# Patient Record
Sex: Female | Born: 1995 | Race: Black or African American | Hispanic: No | Marital: Single | Smoking: Current every day smoker
Health system: Southern US, Community
[De-identification: ages and names within clinical notes are randomized; demographics above are authoritative.]

## PROBLEM LIST (undated history)

## (undated) ENCOUNTER — Inpatient Hospital Stay (HOSPITAL_COMMUNITY): Payer: Self-pay

## (undated) DIAGNOSIS — N83209 Unspecified ovarian cyst, unspecified side: Secondary | ICD-10-CM

## (undated) DIAGNOSIS — Z789 Other specified health status: Secondary | ICD-10-CM

## (undated) HISTORY — PX: NO PAST SURGERIES: SHX2092

## (undated) HISTORY — PX: WISDOM TOOTH EXTRACTION: SHX21

---

## 2018-10-24 ENCOUNTER — Encounter (HOSPITAL_COMMUNITY): Payer: Self-pay | Admitting: *Deleted

## 2018-10-24 ENCOUNTER — Inpatient Hospital Stay (HOSPITAL_COMMUNITY): Payer: Commercial Managed Care - PPO

## 2018-10-24 ENCOUNTER — Inpatient Hospital Stay (HOSPITAL_COMMUNITY)
Admission: AD | Admit: 2018-10-24 | Discharge: 2018-10-24 | Disposition: A | Discharge status: Home / Self Care | Payer: Commercial Managed Care - PPO | Attending: Obstetrics and Gynecology | Admitting: Obstetrics and Gynecology

## 2018-10-24 ENCOUNTER — Other Ambulatory Visit: Payer: Self-pay

## 2018-10-24 DIAGNOSIS — Z87891 Personal history of nicotine dependence: Secondary | ICD-10-CM | POA: Insufficient documentation

## 2018-10-24 DIAGNOSIS — O208 Other hemorrhage in early pregnancy: Secondary | ICD-10-CM

## 2018-10-24 DIAGNOSIS — O034 Incomplete spontaneous abortion without complication: Secondary | ICD-10-CM | POA: Diagnosis not present

## 2018-10-24 DIAGNOSIS — O209 Hemorrhage in early pregnancy, unspecified: Secondary | ICD-10-CM

## 2018-10-24 HISTORY — DX: Other specified health status: Z78.9

## 2018-10-24 LAB — URINALYSIS, ROUTINE W REFLEX MICROSCOPIC
Bilirubin Urine: NEGATIVE
GLUCOSE, UA: NEGATIVE mg/dL
Ketones, ur: NEGATIVE mg/dL
Nitrite: NEGATIVE
Protein, ur: 30 mg/dL — AB
RBC / HPF: >50 RBC/hpf — ABNORMAL HIGH (ref 0–5)
Specific Gravity, Urine: 1.026 (ref 1.005–1.030)
pH: 5 (ref 5.0–8.0)

## 2018-10-24 LAB — CBC
HCT: 44 % (ref 36.0–46.0)
Hemoglobin: 14.7 g/dL (ref 12.0–15.0)
MCH: 30.2 pg (ref 26.0–34.0)
MCHC: 33.4 g/dL (ref 30.0–36.0)
MCV: 90.3 fL (ref 80.0–100.0)
Platelets: 236 10*3/uL (ref 150–400)
RBC: 4.87 MIL/uL (ref 3.87–5.11)
RDW: 12.6 % (ref 11.5–15.5)
WBC: 11.1 10*3/uL — ABNORMAL HIGH (ref 4.0–10.5)
nRBC: 0 % (ref 0.0–0.2)

## 2018-10-24 LAB — ABO/RH: ABO/RH(D): A POS

## 2018-10-24 LAB — WET PREP, GENITAL
Sperm: NONE SEEN
Trich, Wet Prep: NONE SEEN
Yeast Wet Prep HPF POC: NONE SEEN

## 2018-10-24 LAB — POCT PREGNANCY, URINE: Preg Test, Ur: POSITIVE — AB

## 2018-10-24 LAB — HCG, QUANTITATIVE, PREGNANCY: hCG, Beta Chain, Quant, S: 6716 m[IU]/mL — ABNORMAL HIGH (ref <5)

## 2018-10-24 MED ORDER — PROMETHAZINE HCL 25 MG PO TABS: 25.0000 mg | ORAL_TABLET | Freq: Four times a day (QID) | ORAL | 0 refills | Status: DC | PRN

## 2018-10-24 MED ORDER — IBUPROFEN 800 MG PO TABS: 800.0000 mg | ORAL_TABLET | Freq: Three times a day (TID) | ORAL | 0 refills | Status: DC

## 2018-10-24 MED ORDER — OXYCODONE-ACETAMINOPHEN 5-325 MG PO TABS: 1.0000 | ORAL_TABLET | ORAL | 0 refills | Status: DC | PRN

## 2018-10-24 NOTE — MAU Provider Note (Signed)
History     CSN: [id number]  Arrival date and time: 10/24/18 1331   First Provider Initiated Contact with Patient 10/24/18 1416      Chief Complaint  Patient presents with  . Vaginal Bleeding  . Abdominal Pain   HPI Sue Lee is a 23 y.o. G3P2002 at [redacted]w[redacted]d who presents with vaginal bleeding and pain. She states she started spotting on Tuesday and it has gradually gotten worse. She also reports cramps that she rates a 7/10 for the last 3 days. She denies any discharge. Reports a positive HPT last month. She has not been seen anywhere for the pregnancy yet.  OB History    Gravida  3   Para  2   Term  2   Preterm      AB      Living  2     SAB      TAB      Ectopic      Multiple      Live Births  2           Past Medical History:  Diagnosis Date  . Medical history non-contributory     Past Surgical History:  Procedure Laterality Date  . NO PAST SURGERIES      Family History  Problem Relation Age of Onset  . Healthy Mother   . Healthy Father     Social History   Tobacco Use  . Smoking status: Former Smoker    Types: Cigarettes  . Smokeless tobacco: Never Used  Substance Use Topics  . Alcohol use: Not Currently  . Drug use: Never    Allergies: No Known Allergies  Medications Prior to Admission  Medication Sig Dispense Refill Last Dose  . prenatal vitamin w/FE, FA (PRENATAL 1 + 1) 27-1 MG TABS tablet Take 1 tablet by mouth daily at 12 noon.   10/24/2018 at Unknown time    Review of Systems  Constitutional: Negative.  Negative for fatigue and fever.  HENT: Negative.   Respiratory: Negative.  Negative for shortness of breath.   Cardiovascular: Negative.  Negative for chest pain.  Gastrointestinal: Positive for abdominal pain. Negative for constipation, diarrhea, nausea and vomiting.  Genitourinary: Positive for vaginal bleeding. Negative for dysuria.  Neurological: Negative.  Negative for dizziness and headaches.   Physical Exam    Blood pressure 118/90, pulse 85, temperature 98.4 F (36.9 C), resp. rate 20, height 5\' 2"  (1.575 m), weight 49.7 kg, last menstrual period 08/17/2018, SpO2 100 %.  Physical Exam  Nursing note and vitals reviewed. Constitutional: She is oriented to person, place, and time. She appears well-developed and well-nourished. No distress.  HENT:  Head: Normocephalic.  Eyes: Pupils are equal, round, and reactive to light.  Cardiovascular: Normal rate, regular rhythm and normal heart sounds.  Respiratory: Effort normal and breath sounds normal. No respiratory distress.  GI: Soft. Bowel sounds are normal. She exhibits no distension. There is no abdominal tenderness.  Genitourinary:    Genitourinary Comments: Moderate amount of bright red bleeding in vault and coming from os. Tenderness on right adnexa   Neurological: She is alert and oriented to person, place, and time.  Skin: Skin is warm and dry.  Psychiatric: She has a normal mood and affect. Her behavior is normal. Judgment and thought content normal.    MAU Course  Procedures Results for orders placed or performed during the hospital encounter of 10/24/18 (from the past 24 hour(s))  Pregnancy, urine POC     Status:  Abnormal   Collection Time: 10/24/18  1:53 PM  Result Value Ref Range   Preg Test, Ur POSITIVE (A) NEGATIVE  Urinalysis, Routine w reflex microscopic     Status: Abnormal   Collection Time: 10/24/18  1:55 PM  Result Value Ref Range   Color, Urine YELLOW YELLOW   APPearance HAZY (A) CLEAR   Specific Gravity, Urine 1.026 1.005 - 1.030   pH 5.0 5.0 - 8.0   Glucose, UA NEGATIVE NEGATIVE mg/dL   Hgb urine dipstick LARGE (A) NEGATIVE   Bilirubin Urine NEGATIVE NEGATIVE   Ketones, ur NEGATIVE NEGATIVE mg/dL   Protein, ur 30 (A) NEGATIVE mg/dL   Nitrite NEGATIVE NEGATIVE   Leukocytes,Ua SMALL (A) NEGATIVE   RBC / HPF >50 (H) 0 - 5 RBC/hpf   WBC, UA 6-10 0 - 5 WBC/hpf   Bacteria, UA RARE (A) NONE SEEN   Squamous Epithelial  / LPF 6-10 0 - 5   Mucus PRESENT   CBC     Status: Abnormal   Collection Time: 10/24/18  2:39 PM  Result Value Ref Range   WBC 11.1 (H) 4.0 - 10.5 K/uL   RBC 4.87 3.87 - 5.11 MIL/uL   Hemoglobin 14.7 12.0 - 15.0 g/dL   HCT 42.6 83.4 - 19.6 %   MCV 90.3 80.0 - 100.0 fL   MCH 30.2 26.0 - 34.0 pg   MCHC 33.4 30.0 - 36.0 g/dL   RDW 22.2 97.9 - 89.2 %   Platelets 236 150 - 400 K/uL   nRBC 0.0 0.0 - 0.2 %  hCG, quantitative, pregnancy     Status: Abnormal   Collection Time: 10/24/18  2:39 PM  Result Value Ref Range   hCG, Beta Chain, Quant, S 6,716 (H) <5 mIU/mL  ABO/Rh     Status: None   Collection Time: 10/24/18  2:39 PM  Result Value Ref Range   ABO/RH(D) A POS    No rh immune globuloin      NOT A RH IMMUNE GLOBULIN CANDIDATE, PT RH POSITIVE Performed at [facility], [address], [place], [place] [address]   Wet prep, genital     Status: Abnormal   Collection Time: 10/24/18  3:03 PM  Result Value Ref Range   Yeast Wet Prep HPF POC NONE SEEN NONE SEEN   Trich, Wet Prep NONE SEEN NONE SEEN   Clue Cells Wet Prep HPF POC PRESENT (A) NONE SEEN   WBC, Wet Prep HPF POC MANY (A) NONE SEEN   Sperm NONE SEEN    US Ob Less Than 14 Weeks With Ob Transvaginal  Result Date: 10/24/2018 CLINICAL DATA:  Vaginal bleeding in pregnant patient. EXAM: OBSTETRIC <14 WK [place] AND TRANSVAGINAL OB US TECHNIQUE: Both transabdominal and transvaginal ultrasound examinations were performed for complete evaluation of the gestation as well as the maternal uterus, adnexal regions, and pelvic cul-de-sac. Transvaginal technique was performed to assess early pregnancy. COMPARISON:  None. FINDINGS: Intrauterine gestational sac: There is a gestational sac in the lower uterine segment. Yolk sac:  Possible Embryo:  Possible Cardiac Activity: No MSD:   mm    w     d CRL:  4.8 mm   6 w   1 d                  [place] [facility]: June 19, 2019 Subchorionic hemorrhage:  Moderate Maternal uterus/adnexae: Normal. IMPRESSION: The  findings are most consistent with a spontaneous abortion in progress with an irregular gestational sac,  possibly containing a yolk sac and embryo, in the lower uterine segment with moderate subchorionic blood. Electronically Signed   By: Gerome Samavid  Williams III M.D   On: 10/24/2018 16:30   MDM UA, UPT CBC, HCG ABO/Rh- A Pos Wet prep and gc/chlamydia US OB Comp Less 14 weeks with Transvaginal  Results reviewed with patient. Options of expectant management vs. cytotec reviewed. Patient desires expectant management.   Assessment and Plan   1. Incomplete miscarriage   2. Vaginal bleeding affecting early pregnancy    -Discharge home in stable condition -Rx for limited percocet and ibuprofen given to patient. -Vaginal bleeding and pain precautions discussed -[facility] -Patient may return to MAU as needed or if her condition were to change or worsen   Rolm BookbinderCaroline M  CNM 10/24/2018, 4:36 PM

## 2018-10-24 NOTE — MAU Note (Signed)
Presents with c/o VB and lower abdominal pain, greater on right side that began Tuesday.  LMP 08/17/18.  Reports +HPT.

## 2018-10-24 NOTE — Discharge Instructions (Signed)
Miscarriage  A miscarriage is the loss of an unborn baby (fetus) before the 20th week of pregnancy. Most miscarriages happen during the first 3 months of pregnancy. Sometimes, a miscarriage can happen before a woman knows that she is pregnant.  Having a miscarriage can be an emotional experience. If you have had a miscarriage, talk with your health care provider about any questions you may have about miscarrying, the grieving process, and your plans for future pregnancy.  What are the causes?  A miscarriage may be caused by:  · Problems with the genes or chromosomes of the fetus. These problems make it impossible for the baby to develop normally. They are often the result of random errors that occur early in the development of the baby, and are not passed from parent to child (not inherited).  · Infection of the cervix or uterus.  · Conditions that affect hormone balance in the body.  · Problems with the cervix, such as the cervix opening and thinning before pregnancy is at term (cervical insufficiency).  · Problems with the uterus. These may include:  ? A uterus with an abnormal shape.  ? Fibroids in the uterus.  ? Congenital abnormalities. These are problems that were present at birth.  · Certain medical conditions.  · Smoking, drinking alcohol, or using drugs.  · Injury (trauma).  In many cases, the cause of a miscarriage is not known.  What are the signs or symptoms?  Symptoms of this condition include:  · Vaginal bleeding or spotting, with or without cramps or pain.  · Pain or cramping in the abdomen or lower back.  · Passing fluid, tissue, or blood clots from the vagina.  How is this diagnosed?  This condition may be diagnosed based on:  · A physical exam.  · Ultrasound.  · Blood tests.  · Urine tests.  How is this treated?  Treatment for a miscarriage is sometimes not necessary if you naturally pass all the tissue that was in your uterus. If necessary, this condition may be treated with:  · Dilation and  curettage (D&C). This is a procedure in which the cervix is stretched open and the lining of the uterus (endometrium) is scraped. This is done only if tissue from the fetus or placenta remains in the body (incomplete miscarriage).  · Medicines, such as:  ? Antibiotic medicine, to treat infection.  ? Medicine to help the body pass any remaining tissue.  ? Medicine to reduce (contract) the size of the uterus. These medicines may be given if you have a lot of bleeding.  If you have Rh negative blood and your baby was Rh positive, you will need a shot of a medicine called Rh immunoglobulinto protect your future babies from Rh blood problems. "Rh-negative" and "Rh-positive" refer to whether or not the blood has a specific protein found on the surface of red blood cells (Rh factor).  Follow these instructions at home:  Medicines    · Take over-the-counter and prescription medicines only as told by your health care provider.  · If you were prescribed antibiotic medicine, take it as told by your health care provider. Do not stop taking the antibiotic even if you start to feel better.  · Do not take NSAIDs, such as aspirin and ibuprofen, unless they are approved by your health care provider. These medicines can cause bleeding.  Activity  · Rest as directed. Ask your health care provider what activities are safe for you.  · Have someone   help with home and family responsibilities during this time.  General instructions  · Keep track of the number of sanitary pads you use each day and how soaked (saturated) they are. Write down this information.  · Monitor the amount of tissue or blood clots that you pass from your vagina. Save any large amounts of tissue for your health care provider to examine.  · Do not use tampons, douche, or have sex until your health care provider approves.  · To help you and your partner with the process of grieving, talk with your health care provider or seek counseling.  · When you are ready, meet with  your health care provider to discuss any important steps you should take for your health. Also, discuss steps you should take to have a healthy pregnancy in the future.  · Keep all follow-up visits as told by your health care provider. This is important.  Where to find more information  · The American Congress of Obstetricians and Gynecologists: www.acog.org  · U.S. Department of Health and Human Services Office of Women’s Health: www.womenshealth.gov  Contact a health care provider if:  · You have a fever or chills.  · You have a foul smelling vaginal discharge.  · You have more bleeding instead of less.  Get help right away if:  · You have severe cramps or pain in your back or abdomen.  · You pass blood clots or tissue from your vagina that is walnut-sized or larger.  · You soak more than 1 regular sanitary pad in an hour.  · You become light-headed or weak.  · You pass out.  · You have feelings of sadness that take over your thoughts, or you have thoughts of hurting yourself.  Summary  · Most miscarriages happen in the first 3 months of pregnancy. Sometimes miscarriage happens before a woman even knows that she is pregnant.  · Follow your health care provider's instruction for home care. Keep all follow-up appointments.  · To help you and your partner with the process of grieving, talk with your health care provider or seek counseling.  This information is not intended to replace advice given to you by your health care provider. Make sure you discuss any questions you have with your health care provider.  Document Released: 01/08/2001 Document Revised: 08/20/2016 Document Reviewed: 08/20/2016  Elsevier Interactive Patient Education © 2019 [employer]

## 2018-10-25 LAB — CULTURE, OB URINE: Culture: NO GROWTH

## 2018-10-26 LAB — GC/CHLAMYDIA PROBE AMP (~~LOC~~) NOT AT ARMC
Chlamydia: NEGATIVE
Neisseria Gonorrhea: NEGATIVE

## 2020-12-16 IMAGING — US OBSTETRIC <14 WK US AND TRANSVAGINAL OB US
1 series · 15 of 28 positions shown · non-contrast
Comparison: None.

CLINICAL DATA: Vaginal bleeding in pregnant patient.

EXAM:
OBSTETRIC <14 WK US AND TRANSVAGINAL OB US
TECHNIQUE: Both transabdominal and transvaginal ultrasound examinations were
performed for complete evaluation of the gestation as well as the
maternal uterus, adnexal regions, and pelvic cul-de-sac.
Transvaginal technique was performed to assess early pregnancy.

[Series 1: obstetric <14 wk us and transvaginal ob us · 83 acquisitions, 15 frames shown]
[im 1/83]
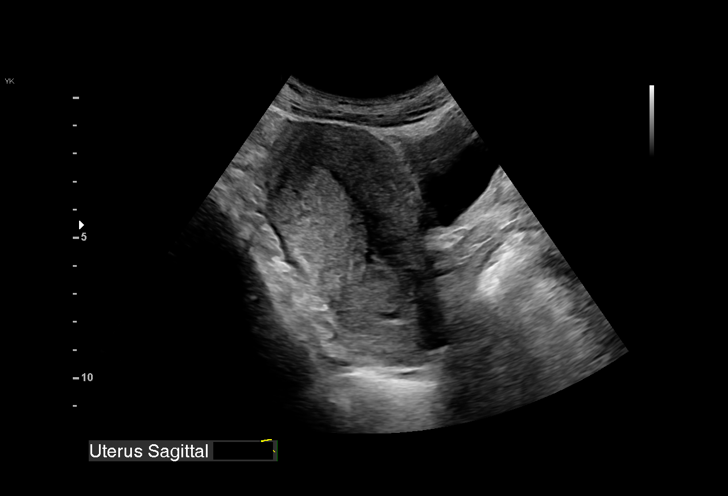
[im 7/83]
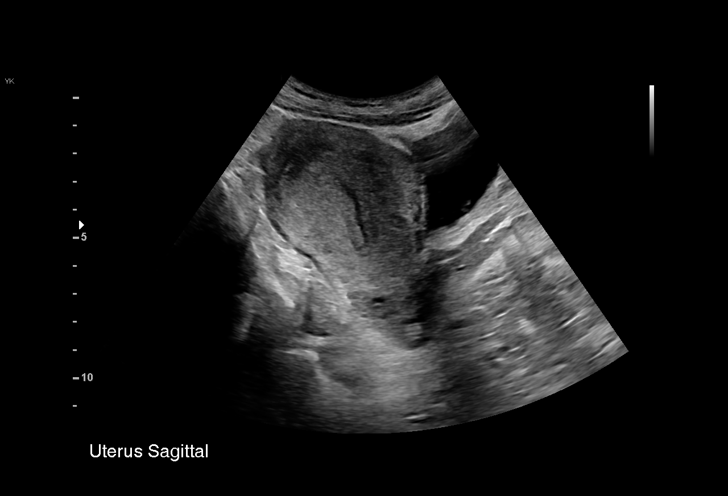
[im 13/83]
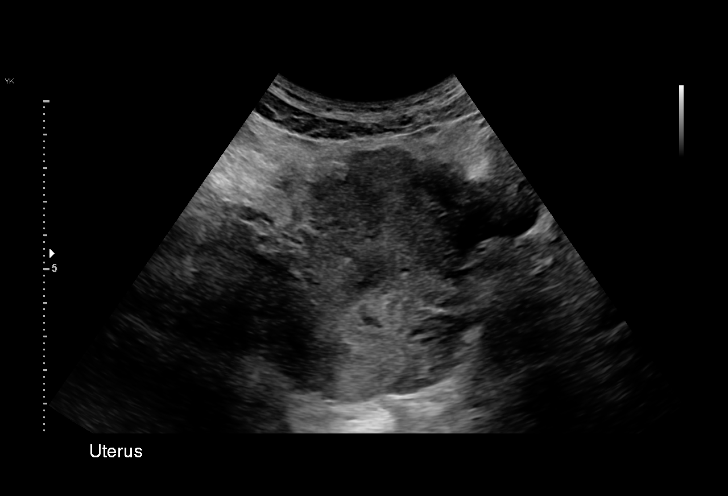
[im 19/83]
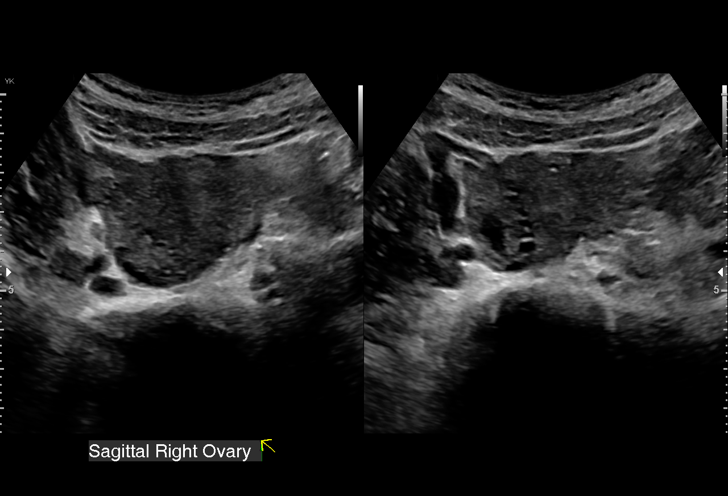
[im 25/83]
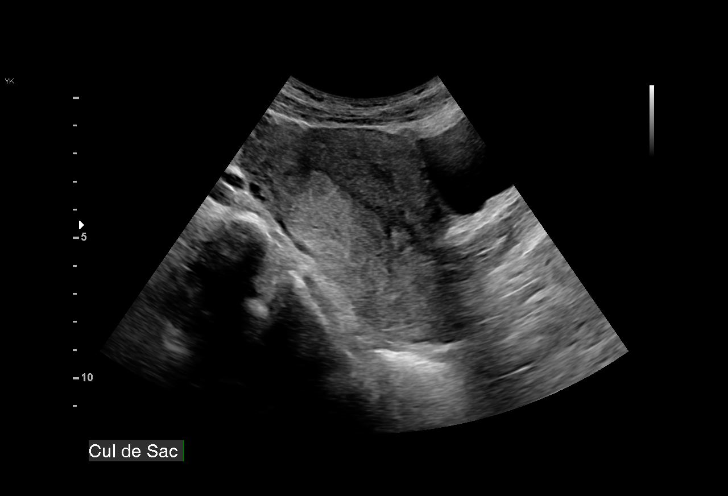
[im 31/83]
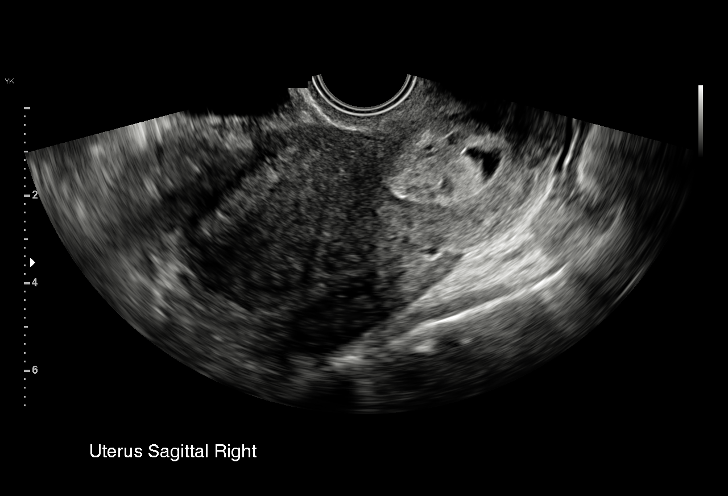
[im 37/83]
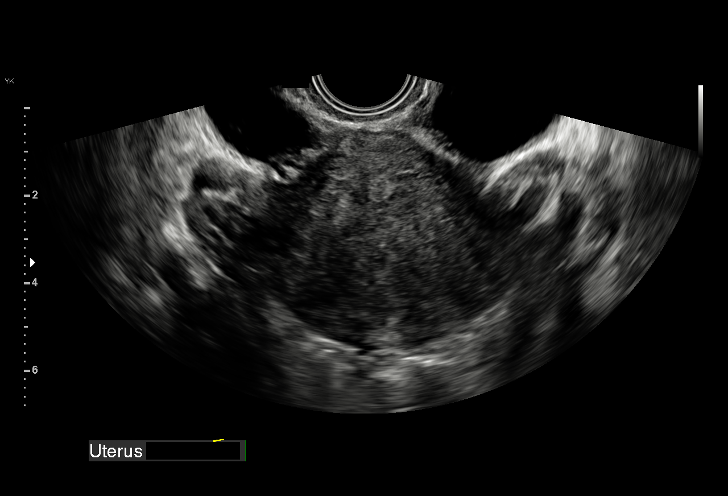
[im 43/83]
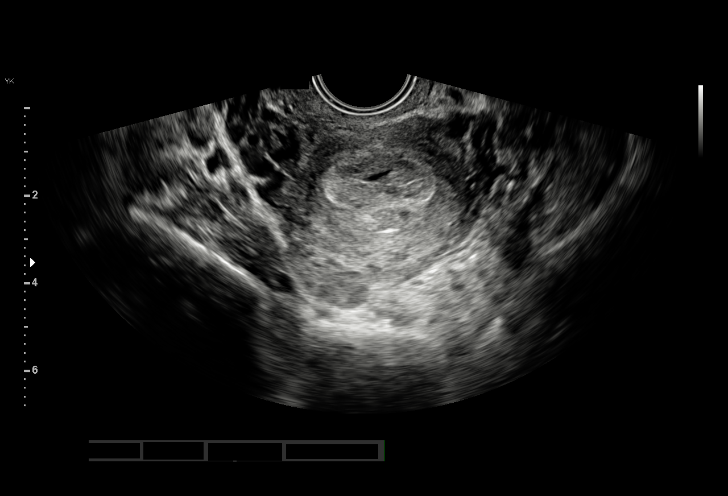
[im 46/83]
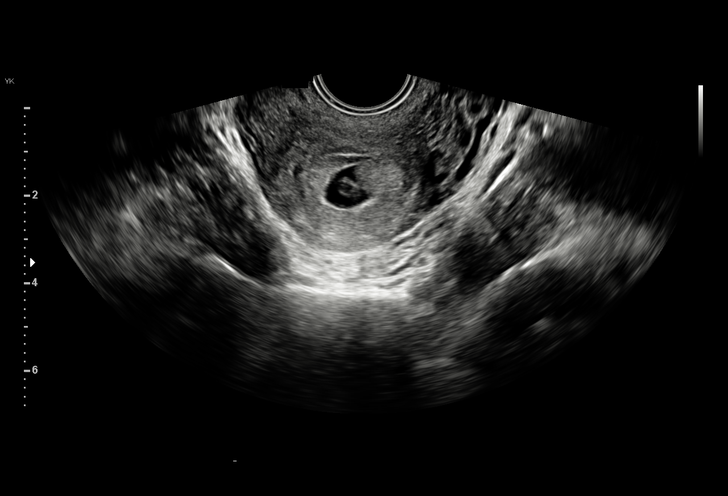
[im 52/83]
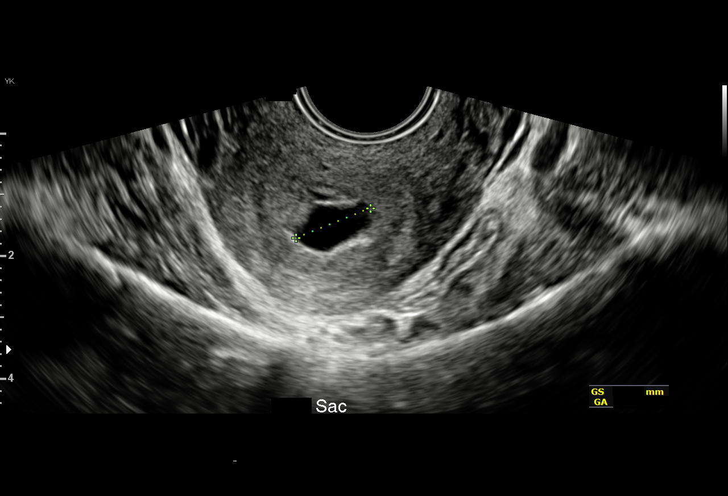
[im 58/83]
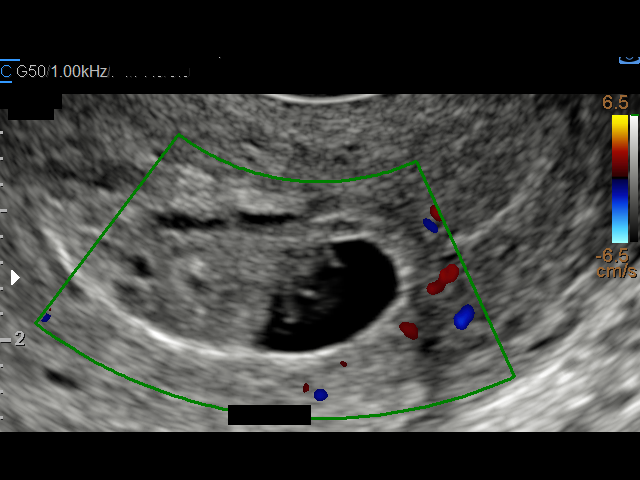
[im 64/83]
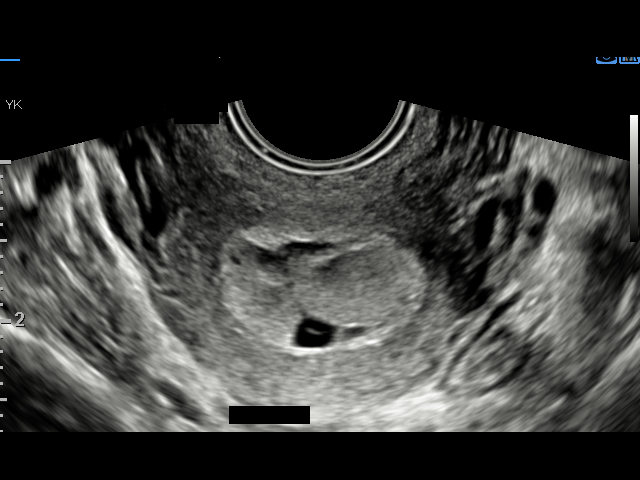
[im 70/83]
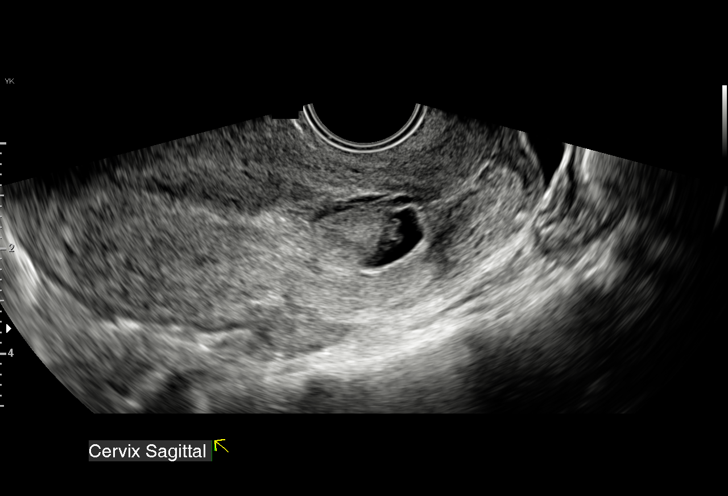
[im 76/83]
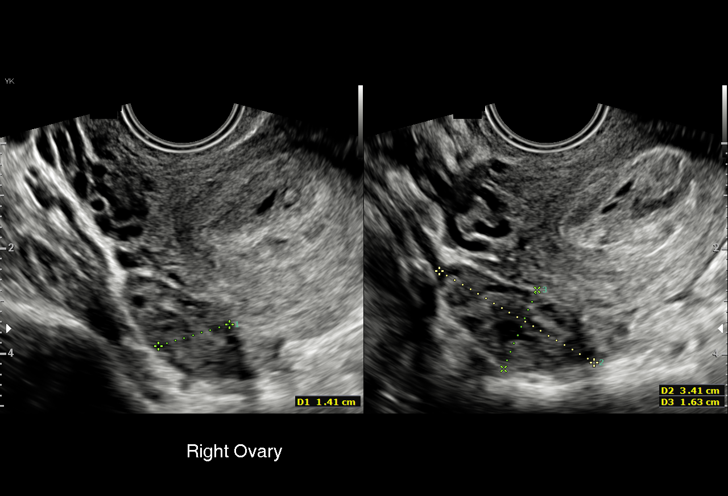
[im 83/83]
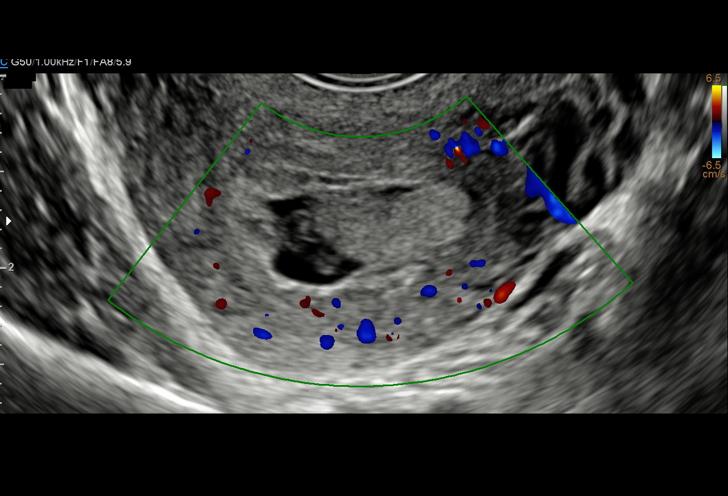

[15 of 28 positions shown; findings below may reference images not displayed]

FINDINGS: Intrauterine gestational sac: There is a gestational sac in the
lower uterine segment.

Yolk sac:  Possible

Embryo:  Possible

Cardiac Activity: No

MSD:   mm    w     d

CRL:  4.8 mm   6 w   1 d                  US EDC: June 19, 2019

Subchorionic hemorrhage:  Moderate

Maternal uterus/adnexae: Normal.
IMPRESSION: The findings are most consistent with a spontaneous abortion in
progress with an irregular gestational sac, possibly containing a
yolk sac and embryo, in the lower uterine segment with moderate
subchorionic blood.

## 2021-07-06 ENCOUNTER — Other Ambulatory Visit: Payer: Self-pay

## 2021-07-06 ENCOUNTER — Emergency Department (HOSPITAL_COMMUNITY)
Admission: EM | Admit: 2021-07-06 | Discharge: 2021-07-06 | Discharge status: Against Medical Advice | Payer: Medicaid Other

## 2021-07-06 NOTE — ED Notes (Signed)
Pt called for triage no answer  °

## 2021-07-07 ENCOUNTER — Emergency Department (HOSPITAL_COMMUNITY)
Admission: EM | Admit: 2021-07-07 | Discharge: 2021-07-07 | Disposition: A | Discharge status: Home / Self Care | Payer: Medicaid Other | Attending: Emergency Medicine | Admitting: Emergency Medicine

## 2021-07-07 ENCOUNTER — Other Ambulatory Visit: Payer: Self-pay

## 2021-07-07 ENCOUNTER — Encounter (HOSPITAL_COMMUNITY): Payer: Self-pay | Admitting: Emergency Medicine

## 2021-07-07 DIAGNOSIS — M62830 Muscle spasm of back: Secondary | ICD-10-CM | POA: Insufficient documentation

## 2021-07-07 DIAGNOSIS — F17210 Nicotine dependence, cigarettes, uncomplicated: Secondary | ICD-10-CM | POA: Diagnosis not present

## 2021-07-07 DIAGNOSIS — Y92410 Unspecified street and highway as the place of occurrence of the external cause: Secondary | ICD-10-CM | POA: Insufficient documentation

## 2021-07-07 DIAGNOSIS — M545 Low back pain, unspecified: Secondary | ICD-10-CM | POA: Diagnosis present

## 2021-07-07 MED ORDER — IBUPROFEN 400 MG PO TABS
800.0000 mg | ORAL_TABLET | Freq: Once | ORAL | Status: AC
  Administered 2021-07-07: 800 mg via ORAL
  Filled 2021-07-07: qty 2

## 2021-07-07 MED ORDER — METHOCARBAMOL 500 MG PO TABS: 500.0000 mg | ORAL_TABLET | Freq: Two times a day (BID) | ORAL | 0 refills | Status: DC

## 2021-07-07 MED ORDER — METHOCARBAMOL 500 MG PO TABS
500.0000 mg | ORAL_TABLET | Freq: Once | ORAL | Status: AC
  Administered 2021-07-07: 500 mg via ORAL
  Filled 2021-07-07 (×2): qty 1
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
## 2021-07-07 NOTE — ED Notes (Signed)
D/c papers discussed

## 2021-07-07 NOTE — ED Notes (Signed)
ED Provider at bedside. 
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
## 2021-07-07 NOTE — Discharge Instructions (Signed)

## 2021-07-11 ENCOUNTER — Encounter (HOSPITAL_COMMUNITY): Payer: Self-pay

## 2021-07-11 ENCOUNTER — Ambulatory Visit (HOSPITAL_COMMUNITY)
Admission: EM | Admit: 2021-07-11 | Discharge: 2021-07-11 | Disposition: A | Discharge status: Home / Self Care | Payer: Medicaid Other | Attending: Physician Assistant | Admitting: Physician Assistant

## 2021-07-11 ENCOUNTER — Other Ambulatory Visit: Payer: Self-pay

## 2021-07-11 DIAGNOSIS — M549 Dorsalgia, unspecified: Secondary | ICD-10-CM | POA: Diagnosis not present

## 2021-07-11 MED ORDER — DICLOFENAC SODIUM 75 MG PO TBEC: 75.0000 mg | DELAYED_RELEASE_TABLET | Freq: Two times a day (BID) | ORAL | 0 refills | Status: DC
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
## 2021-11-03 ENCOUNTER — Emergency Department (HOSPITAL_COMMUNITY): Payer: Medicaid Other

## 2021-11-03 ENCOUNTER — Other Ambulatory Visit: Payer: Self-pay

## 2021-11-03 ENCOUNTER — Encounter (HOSPITAL_COMMUNITY): Payer: Self-pay | Admitting: Emergency Medicine

## 2021-11-03 ENCOUNTER — Emergency Department (HOSPITAL_COMMUNITY)
Admission: EM | Admit: 2021-11-03 | Discharge: 2021-11-03 | Disposition: A | Discharge status: Home / Self Care | Payer: Medicaid Other | Attending: Emergency Medicine | Admitting: Emergency Medicine

## 2021-11-03 DIAGNOSIS — N9489 Other specified conditions associated with female genital organs and menstrual cycle: Secondary | ICD-10-CM | POA: Diagnosis not present

## 2021-11-03 DIAGNOSIS — N83202 Unspecified ovarian cyst, left side: Secondary | ICD-10-CM | POA: Diagnosis not present

## 2021-11-03 DIAGNOSIS — N938 Other specified abnormal uterine and vaginal bleeding: Secondary | ICD-10-CM | POA: Diagnosis not present

## 2021-11-03 DIAGNOSIS — N83209 Unspecified ovarian cyst, unspecified side: Secondary | ICD-10-CM

## 2021-11-03 DIAGNOSIS — R102 Pelvic and perineal pain: Secondary | ICD-10-CM | POA: Insufficient documentation

## 2021-11-03 DIAGNOSIS — N939 Abnormal uterine and vaginal bleeding, unspecified: Secondary | ICD-10-CM | POA: Diagnosis present

## 2021-11-03 LAB — URINALYSIS, ROUTINE W REFLEX MICROSCOPIC
Bilirubin Urine: NEGATIVE
Glucose, UA: NEGATIVE mg/dL
Ketones, ur: NEGATIVE mg/dL
Leukocytes,Ua: NEGATIVE
Nitrite: NEGATIVE
Protein, ur: NEGATIVE mg/dL
RBC / HPF: >50 RBC/hpf — ABNORMAL HIGH (ref 0–5)
Specific Gravity, Urine: 1.019 (ref 1.005–1.030)
pH: 6 (ref 5.0–8.0)

## 2021-11-03 LAB — CBC
HCT: 42.4 % (ref 36.0–46.0)
Hemoglobin: 14 g/dL (ref 12.0–15.0)
MCH: 30.6 pg (ref 26.0–34.0)
MCHC: 33 g/dL (ref 30.0–36.0)
MCV: 92.8 fL (ref 80.0–100.0)
Platelets: 287 10*3/uL (ref 150–400)
RBC: 4.57 MIL/uL (ref 3.87–5.11)
RDW: 14.5 % (ref 11.5–15.5)
WBC: 8.1 10*3/uL (ref 4.0–10.5)
nRBC: 0 % (ref 0.0–0.2)

## 2021-11-03 LAB — I-STAT BETA HCG BLOOD, ED (MC, WL, AP ONLY): I-stat hCG, quantitative: <5 m[IU]/mL (ref <5)

## 2021-11-03 LAB — COMPREHENSIVE METABOLIC PANEL
ALT: 10 U/L (ref 0–44)
AST: 14 U/L — ABNORMAL LOW (ref 15–41)
Albumin: 3.8 g/dL (ref 3.5–5.0)
Alkaline Phosphatase: 34 U/L — ABNORMAL LOW (ref 38–126)
Anion gap: 8 (ref 5–15)
BUN: 9 mg/dL (ref 6–20)
CO2: 25 mmol/L (ref 22–32)
Calcium: 9.4 mg/dL (ref 8.9–10.3)
Chloride: 105 mmol/L (ref 98–111)
Creatinine, Ser: 0.85 mg/dL (ref 0.44–1.00)
GFR, Estimated: >60 mL/min (ref >60)
Glucose, Bld: 89 mg/dL (ref 70–99)
Potassium: 4 mmol/L (ref 3.5–5.1)
Sodium: 138 mmol/L (ref 135–145)
Total Bilirubin: 0.4 mg/dL (ref 0.3–1.2)
Total Protein: 6.7 g/dL (ref 6.5–8.1)

## 2021-11-03 LAB — LIPASE, BLOOD: Lipase: 31 U/L (ref 11–51)

## 2021-11-03 MED ORDER — MEGESTROL ACETATE 40 MG PO TABS: 40.0000 mg | ORAL_TABLET | Freq: Two times a day (BID) | ORAL | 0 refills | Status: DC

## 2021-11-03 NOTE — ED Notes (Signed)
Pt A&OX4 ambulatory at d/c with independent steady gait, NAD. Pt verbalized understanding of d/c instructions, prescriptions and follow up care. 

## 2021-11-03 NOTE — ED Triage Notes (Signed)
Pt reports vaginal bleeding x 21 days and abd cramping. ?

## 2021-11-03 NOTE — ED Notes (Signed)
Pt ambulatory to room with independent steady gait 

## 2021-11-03 NOTE — ED Provider Notes (Signed)
?[facility] EMERGENCY DEPARTMENT ?Provider Note ? ? ?CSN: [id number] ?Arrival date & time: 11/03/21  1401 ? ?  ? ?History ? ?Chief Complaint  ?Patient presents with  ? Vaginal Bleeding  ? ? ?Sue Lee is a 26 y.o. female. ? ?HPI ? ?  ? ?26 year old female comes in with chief complaint of vaginal bleeding.  Patient indicates that she has been having vaginal bleeding for the last 21 days.  She has never had any history of heavy bleeding like this.  Bleeding started with her period, and then improved to spotting, but a week later became heavy again.  She is having some abdominal cramping over the last 7 days in the lower quadrants.  Patient is married, in a monogamous relationship and does not think she is at risk for STDs.  She denies any new vaginal bleeding, burning with urination, UTI-like symptoms.  She is not on any blood thinners. ? ?Home Medications ?Prior to Admission medications   ?Medication Sig Start Date End Date Taking? Authorizing Provider  ?megestrol (MEGACE) 40 MG tablet Take 1 tablet (40 mg total) by mouth 2 (two) times daily. 11/03/21  Yes Derwood Kaplan, MD  ?diclofenac (VOLTAREN) 75 MG EC tablet Take 1 tablet (75 mg total) by mouth 2 (two) times daily. 07/11/21   Elson Areas, PA-C  ?methocarbamol (ROBAXIN) 500 MG tablet Take 1 tablet (500 mg total) by mouth 2 (two) times daily. 07/07/21   Muthersbaugh, Dahlia Client, PA-C  ?   ? ?Allergies    ?Patient has no known allergies.   ? ?Review of Systems   ?Review of Systems  ?All other systems reviewed and are negative. ? ?Physical Exam ?Updated Vital Signs ?BP 104/68   Pulse 67   Temp 98.4 ?F (36.9 ?C)   Resp 17   SpO2 100%  ?Physical Exam ?Vitals and nursing note reviewed.  ?Constitutional:   ?   Appearance: She is well-developed.  ?HENT:  ?   Head: Atraumatic.  ?Eyes:  ?   Extraocular Movements: Extraocular movements intact.  ?   Pupils: Pupils are equal, round, and reactive to light.  ?Cardiovascular:  ?   Rate and Rhythm: Normal  rate.  ?Pulmonary:  ?   Effort: Pulmonary effort is normal.  ?Abdominal:  ?   Tenderness: There is abdominal tenderness.  ?   Comments: Patient has lower quadrant abdominal tenderness, no rebound or guarding, abdomen is soft.  ?Musculoskeletal:  ?   Cervical back: Normal range of motion and neck supple.  ?Skin: ?   General: Skin is warm and dry.  ?Neurological:  ?   Mental Status: She is alert and oriented to person, place, and time.  ? ? ?ED Results / Procedures / Treatments   ?Labs ?(all labs ordered are listed, but only abnormal results are displayed) ?Labs Reviewed  ?COMPREHENSIVE METABOLIC PANEL - Abnormal; Notable for the following components:  ?    Result Value  ? AST 14 (*)   ? Alkaline Phosphatase 34 (*)   ? All other components within normal limits  ?URINALYSIS, ROUTINE W REFLEX MICROSCOPIC - Abnormal; Notable for the following components:  ? APPearance HAZY (*)   ? Hgb urine dipstick LARGE (*)   ? RBC / HPF >50 (*)   ? Bacteria, UA RARE (*)   ? All other components within normal limits  ?LIPASE, BLOOD  ?CBC  ?I-STAT BETA HCG BLOOD, ED ([facility], [facility], [facility] ONLY)  ? ? ?EKG ?None ? ?Radiology ?US PELVIC COMPLETE WITH TRANSVAGINAL ? ?  Result Date: 11/03/2021 ?CLINICAL DATA:  Pelvic pain since last menses. EXAM: TRANSABDOMINAL AND TRANSVAGINAL ULTRASOUND OF PELVIS TECHNIQUE: Both transabdominal and transvaginal ultrasound examinations of the pelvis were performed. Transabdominal technique was performed for global imaging of the pelvis including uterus, ovaries, adnexal regions, and pelvic cul-de-sac. It was necessary to proceed with endovaginal exam following the transabdominal exam to visualize the ovaries, adnexa and endometrium. COMPARISON:  None FINDINGS: Uterus Measurements: 8.1 x 4.2 x 5.2 cm = volume: 93 mL. No fibroids or other mass visualized. Endometrium Thickness: 3 mm.  No focal abnormality visualized. Right ovary Measurements: 3.5 x 1.4 x 2.2 cm = volume: 5.7 mL. Normal appearance/no adnexal mass. Left ovary  Measurements: 5.8 x 3.1 x 3.7 cm = volume: 35 mL. The majority of the ovary is a encompassed by a cyst which spans 4.4 x 3.3 x 3.6 cm; it has several peripherally located rounded septations. Spectral Doppler evaluation was not performed. Other findings No abnormal free fluid. IMPRESSION: 1. There is a complicated LEFT ovarian cyst which measures 4.4 cm with multiple peripheral located septations versus follicles. Asymmetric size of the ovary places the patient at increased risk for ovarian torsion. If there is clinical concern for acute, previous or intermittent ovarian torsion, then recommend gynecologic consultation. Otherwise, recommend follow-up ultrasound in 6 weeks to assess for resolution. Electronically Signed   By: Meda Klinefelter M.D.   On: 11/03/2021 17:27   ? ?Procedures ?Procedures  ? ? ?Medications Ordered in ED ?Medications - No data to display ? ?ED Course/ Medical Decision Making/ A&P ?Clinical Course as of 11/03/21 1859  ?Sat Nov 03, 2021  ?1858 CBC ?Hemoglobin is normal [AN]  ?1858 US PELVIC COMPLETE WITH TRANSVAGINAL ?Ultrasound results reviewed independently.  There is a 4.3 cm ovarian cyst.  Clinical suspicion is not consistent with torsion, which is why we did not order Doppler study.  Patient reassessed, continues to mentioned constant, low-grade pain over her lower abdomen. ? ?Discussed the case with Dr. Alysia Penna, GYN.  She to does not think ovarian cyst at 4.3 cm is concerning unless patient has acute, concerning history.  She recommends that we start patient on Megace and that they will get the patient into the clinic soon ? ?Results discussed with the patient.  Stable for discharge with strict ER return precautions. [AN]  ?  ?Clinical Course User Index ?[AN] Derwood Kaplan, MD  ? ?                        ?Medical Decision Making ?26 year old female comes in with chief complaint of vaginal bleeding for the last 21 days with associated lower quadrant abdominal pain. ? ?She is certain that  she is not pregnant.  She indicates that she started having period, but bleeding improved but never resolved, and 7 days after she started having cramping abdominal pain along with the heavier bleeding.  No history of dysfunctional uterine bleeding.  No known history of ovarian cyst, fibroids. ? ?On exam, patient has reassuring abdominal exam.  She does not think a pelvic exam is needed given that she has no concerns for STI at this time. ? ?It does not appear that she is symptomatic. ? ?Basic labs were ordered.  CBC shows hemoglobin that is within normal limits and at baseline. ? ?Ultrasound pelvis has been ordered.  Unfortunately, patient did call gynecologist but she does not have an appointment until June.  We will get ultrasound now and consider starting her on Provera  or Megace. ? ?Problems Addressed: ?DUB (dysfunctional uterine bleeding): undiagnosed new problem with uncertain prognosis ? ?Amount and/or Complexity of Data Reviewed ?Labs: ordered. ?Radiology: ordered. ? ?Risk ?Prescription drug management. ? ? ? ?Final Clinical Impression(s) / ED Diagnoses ?Final diagnoses:  ?DUB (dysfunctional uterine bleeding)  ?Cyst of ovary, unspecified laterality  ? ? ?Rx / DC Orders ?ED Discharge Orders   ? ?      Ordered  ?  megestrol (MEGACE) 40 MG tablet  2 times daily       ? 11/03/21 1855  ? ?  ?  ? ?  ? ? ?  ?Derwood KaplanNanavati, Ona Rathert, MD ?11/03/21 1900 ? ?

## 2021-11-03 NOTE — Discharge Instructions (Addendum)
We have shared your information with Dr. Rande Lawman, Gynecologist. ?she we will try to get you into the clinic sooner than June.  For now, she has asked that we start you on a medication called Megace. ? ?If you do not hear from the gynecologic clinic by Monday afternoon, call the number provided to set up an appointment.  Inform them that you were seen in the ER and need to be seen sooner than June. ? ?Your blood work is reassuring.  Return to the ER if your bleeding gets worse and you start having dizziness, lightheadedness, shortness of breath ?

## 2021-11-03 NOTE — ED Notes (Signed)
Patient transported to [facility] 

## 2021-11-06 ENCOUNTER — Telehealth: Payer: Self-pay | Admitting: Family Medicine

## 2021-11-06 NOTE — Telephone Encounter (Signed)
Called patient to inform of appointment move, there was no answer to the phone call and the option to leave a voicemail was unavailable. A letter was mailed in the mail.  ?

## 2021-12-03 ENCOUNTER — Encounter: Payer: Medicaid Other | Admitting: Obstetrics & Gynecology

## 2022-01-04 ENCOUNTER — Encounter: Payer: Medicaid Other | Admitting: Obstetrics & Gynecology

## 2022-03-07 ENCOUNTER — Other Ambulatory Visit: Payer: Self-pay

## 2022-03-07 ENCOUNTER — Emergency Department (HOSPITAL_COMMUNITY)
Admission: EM | Admit: 2022-03-07 | Discharge: 2022-03-07 | Disposition: A | Discharge status: Home / Self Care | Payer: Medicaid Other | Attending: Emergency Medicine | Admitting: Emergency Medicine

## 2022-03-07 DIAGNOSIS — H5789 Other specified disorders of eye and adnexa: Secondary | ICD-10-CM | POA: Diagnosis present

## 2022-03-07 DIAGNOSIS — H1031 Unspecified acute conjunctivitis, right eye: Secondary | ICD-10-CM | POA: Insufficient documentation

## 2022-03-07 MED ORDER — POLYMYXIN B-TRIMETHOPRIM 10000-0.1 UNIT/ML-% OP SOLN
2.0000 [drp] | OPHTHALMIC | Status: DC
  Filled 2022-03-07: qty 10

## 2022-03-07 MED ORDER — FLUORESCEIN SODIUM 1 MG OP STRP
1.0000 | ORAL_STRIP | Freq: Once | OPHTHALMIC | Status: DC
  Filled 2022-03-07: qty 1

## 2022-03-07 MED ORDER — TETRACAINE HCL 0.5 % OP SOLN
2.0000 [drp] | Freq: Once | OPHTHALMIC | Status: DC
  Filled 2022-03-07: qty 4

## 2022-03-07 NOTE — ED Provider Triage Note (Signed)
Emergency Medicine Provider Triage Evaluation Note  Sue Lee , a 26 y.o. female  was evaluated in triage.  Pt complains of right eye irritation since Monday. Was wearing fake lashes. Took them off and has had irritation since, feels like something is in her eye. Has not tried anything for her symptoms. Right eye was swollen shut this morning, has been having clear drainage.   Review of Systems  Positive: Eye irritation and drainage Negative: Fevers, URI sx  Physical Exam  BP 115/74 (BP Location: Right Arm)   Pulse 82   Temp 98 F (36.7 C) (Oral)   Resp 20   SpO2 98%  Gen:   Awake, no distress   Resp:  Normal effort  MSK:   Moves extremities without difficulty  Other:  Right eye conjunctival injection  Medical Decision Making  Medically screening exam initiated at 1:26 PM.  Appropriate orders placed.  Sue Lee was informed that the remainder of the evaluation will be completed by another provider, this initial triage assessment does not replace that evaluation, and the importance of remaining in the ED until their evaluation is complete.     Sue Lee T, PA-C 03/07/22 1327

## 2022-03-07 NOTE — Discharge Instructions (Addendum)
Use polytrim drops three times daily for a week.   If you have worse eye swelling and pain, follow up with ophthalmology   Return to ER if you have worse eye pain, purulent discharge, blurry vision

## 2022-03-07 NOTE — ED Provider Notes (Signed)
[facility] EMERGENCY DEPARTMENT Provider Note   CSN: [id number] Arrival date & time: 03/07/22  1216     History  Chief Complaint  Patient presents with   Eye Problem    Sue Lee is a 26 y.o. female here with R eye redness. Patient was putting on fake eye lashes 3 days ago and wasn't sure if the eyelash or glue got into her eye. Sue Lee noticed increased redness of the right eye. Denies purulent discharge. Denies blurry vision   The history is provided by the patient.       Home Medications Prior to Admission medications   Medication Sig Start Date End Date Taking? Authorizing Provider  diclofenac (VOLTAREN) 75 MG EC tablet Take 1 tablet (75 mg total) by mouth 2 (two) times daily. 07/11/21   Elson Areas, PA-C  ibuprofen (ADVIL,MOTRIN) 800 MG tablet Take 1 tablet (800 mg total) by mouth 3 (three) times daily. 10/24/18   Rolm Bookbinder, CNM  megestrol (MEGACE) 40 MG tablet Take 1 tablet (40 mg total) by mouth 2 (two) times daily. 11/03/21   Derwood Kaplan, MD  methocarbamol (ROBAXIN) 500 MG tablet Take 1 tablet (500 mg total) by mouth 2 (two) times daily. 07/07/21   Muthersbaugh, Dahlia Client, PA-C  oxyCODONE-acetaminophen (PERCOCET/ROXICET) 5-325 MG tablet Take 1 tablet by mouth every 4 (four) hours as needed for severe pain. 10/24/18   Rolm Bookbinder, CNM  prenatal vitamin w/FE, FA (PRENATAL 1 + 1) 27-1 MG TABS tablet Take 1 tablet by mouth daily at 12 noon.    [provider]  promethazine (PHENERGAN) 25 MG tablet Take 1 tablet (25 mg total) by mouth every 6 (six) hours as needed for nausea or vomiting. 10/24/18   Rolm Bookbinder, CNM      Allergies    Patient has no known allergies.    Review of Systems   Review of Systems  Eyes:  Positive for redness.  All other systems reviewed and are negative.   Physical Exam Updated Vital Signs BP 115/74 (BP Location: Right Arm)   Pulse 82   Temp 98 F (36.7 C) (Oral)   Resp 20   SpO2 98%   Physical Exam Vitals and nursing note reviewed.  Constitutional:      Appearance: Normal appearance.  HENT:     Head: Normocephalic.     Nose: Nose normal.     Mouth/Throat:     Mouth: Mucous membranes are moist.  Eyes:     Comments: R conjunctiva erythematous. Florescein stain performed and no obvious glue or florescein update. No obvious corneal abrasion. No foreign body under the lid. No eyelid swelling, extra ocular movements intact   Cardiovascular:     Rate and Rhythm: Normal rate and regular rhythm.     Pulses: Normal pulses.     Heart sounds: Normal heart sounds.  Pulmonary:     Effort: Pulmonary effort is normal.     Breath sounds: Normal breath sounds.  Abdominal:     General: Abdomen is flat.     Palpations: Abdomen is soft.  Musculoskeletal:        General: Normal range of motion.     Cervical back: Normal range of motion and neck supple.  Skin:    General: Skin is warm.     Capillary Refill: Capillary refill takes less than 2 seconds.  Neurological:     General: No focal deficit present.     Mental Status: Sue Lee is alert and oriented  to person, place, and time.  Psychiatric:        Mood and Affect: Mood normal.        Behavior: Behavior normal.     ED Results / Procedures / Treatments   Labs (all labs ordered are listed, but only abnormal results are displayed) Labs Reviewed - No data to display  EKG None  Radiology No results found.  Procedures Procedures    Medications Ordered in ED Medications  tetracaine (PONTOCAINE) 0.5 % ophthalmic solution 2 drop (has no administration in time range)  fluorescein ophthalmic strip 1 strip (has no administration in time range)  trimethoprim-polymyxin b (POLYTRIM) ophthalmic solution 2 drop (has no administration in time range)    ED Course/ Medical Decision Making/ A&P                           Medical Decision Making Sue Lee is a 26 y.o. female here with R conjunctivitis. No florescein update or  corneal abrasion or foreign body. No obvious periorbital or orbital cellulitis. Will dc home with polytrim.    Problems Addressed: Acute conjunctivitis of right eye, unspecified acute conjunctivitis type: acute illness or injury  Risk Prescription drug management.    Final Clinical Impression(s) / ED Diagnoses Final diagnoses:  None    Rx / DC Orders ED Discharge Orders     None         Charlynne Pander, MD 03/07/22 1758

## 2022-03-07 NOTE — ED Triage Notes (Signed)
Pt with right eye itching and redness since Monday  Did have on false eyelashes but removed those. Not sure if something is in eye

## 2022-07-04 ENCOUNTER — Ambulatory Visit (HOSPITAL_COMMUNITY)
Admission: EM | Admit: 2022-07-04 | Discharge: 2022-07-04 | Disposition: A | Discharge status: Home / Self Care | Payer: Medicaid Other | Attending: Family Medicine | Admitting: Family Medicine

## 2022-07-04 ENCOUNTER — Encounter (HOSPITAL_COMMUNITY): Payer: Self-pay

## 2022-07-04 DIAGNOSIS — R112 Nausea with vomiting, unspecified: Secondary | ICD-10-CM

## 2022-07-04 DIAGNOSIS — R051 Acute cough: Secondary | ICD-10-CM

## 2022-07-04 DIAGNOSIS — J069 Acute upper respiratory infection, unspecified: Secondary | ICD-10-CM | POA: Diagnosis not present

## 2022-07-04 DIAGNOSIS — Z3201 Encounter for pregnancy test, result positive: Secondary | ICD-10-CM | POA: Diagnosis not present

## 2022-07-04 LAB — POC URINE PREG, ED: Preg Test, Ur: POSITIVE — AB

## 2022-07-04 MED ORDER — VITAMIN B-6 25 MG PO TABS: 25.0000 mg | ORAL_TABLET | Freq: Every day | ORAL | 0 refills | Status: DC

## 2022-07-04 NOTE — ED Triage Notes (Signed)
Pt c/o headache/fever/cough and n/v x3 days. States vomit x3 today, unable to keep water down.

## 2022-07-04 NOTE — ED Provider Notes (Signed)
[facility]   [id number] 07/04/22 Arrival Time: 1545  ASSESSMENT & PLAN:  1. Viral upper respiratory tract infection   2. Acute cough   3. Positive pregnancy test   4. Nausea and vomiting, unspecified vomiting type     Follow-up Information     Schedule an appointment as soon as possible for a visit  with [facility].   Specialty: Obstetrics and Gynecology Contact information: [address] [place] [address] [phone]               Meds ordered this encounter  Medications   pyridOXINE (VITAMIN B6) 25 MG tablet    Sig: Take 1 tablet (25 mg total) by mouth daily.    Dispense:  30 tablet    Refill:  0   Discussed typical duration of likely viral illness. Viral testing declined; negative home COVID test reported. OTC symptom care as needed.  For n/v: New Prescriptions   PYRIDOXINE (VITAMIN B6) 25 MG TABLET    Take 1 tablet (25 mg total) by mouth daily.   No signs of dehydration. Benign abdomen.  Work note provided. Reviewed expectations re: course of current medical issues. Questions answered. Outlined signs and symptoms indicating need for more acute intervention. Understanding verbalized. After Visit Summary given.   SUBJECTIVE: History from: Patient. Sue Lee is a 26 y.o. female. Reports: Pt c/o headache/fever/cough and n/v x3 days. States vomit x3 today, unable to keep water down.  Children with similar. Afebrile. No abd pain. Patient's last menstrual period was 05/06/2022.  OBJECTIVE:  Vitals:   07/04/22 1839  BP: 124/80  Pulse: 87  Resp: 18  Temp: 98.3 F (36.8 C)  TempSrc: Oral  SpO2: 98%    General appearance: alert; no distress Eyes: PERRLA; EOMI; conjunctiva normal HENT: [place]; AT; with nasal congestion Neck: supple  Lungs: speaks full sentences without difficulty; unlabored Abd: soft; NT; no guarding/rebound Extremities: no edema Skin: warm and  dry Neurologic: normal gait Psychological: alert and cooperative; normal mood and affect  Labs: Results for orders placed or performed during the hospital encounter of 07/04/22  POC urine pregnancy  Result Value Ref Range   Preg Test, Ur POSITIVE (A) NEGATIVE   Labs Reviewed  POC URINE PREG, ED - Abnormal; Notable for the following components:      Result Value   Preg Test, Ur POSITIVE (*)    All other components within normal limits    Imaging: No results found.  No Known Allergies  Past Medical History:  Diagnosis Date   Medical history non-contributory    Social History   Socioeconomic History   Marital status: Single    Spouse name: Not on file   Number of children: Not on file   Years of education: Not on file   Highest education level: Not on file  Occupational History   Not on file  Tobacco Use   Smoking status: Every Day    Types: Cigarettes    Passive exposure: Never   Smokeless tobacco: Never  Vaping Use   Vaping Use: Never used  Substance and Sexual Activity   Alcohol use: Never   Drug use: Never   Sexual activity: Yes    Birth control/protection: None  Other Topics Concern   Not on file  Social History Narrative   ** Merged History Encounter **       Social Determinants of Health   Financial Resource Strain: Not on file  Food Insecurity: Not on file  Transportation Needs: Not on file  Physical Activity: Not on file  Stress: Not on file  Social Connections: Not on file  Intimate Partner Violence: Not on file   Family History  Problem Relation Age of Onset   Healthy Mother    Healthy Father    Past Surgical History:  Procedure Laterality Date   NO PAST SURGERIES       Mardella Layman, MD 07/04/22 [phone]

## 2022-07-04 NOTE — Discharge Instructions (Addendum)
You may take Tylenol as needed for any pain. Do not take ibuprofen while pregnant.

## 2022-07-29 NOTE — L&D Delivery Note (Signed)
OB/GYN Faculty Practice Delivery Note  Sue Lee is a 27 y.o. [id number] s/p VD at [redacted]w[redacted]d. She was admitted for PPROM.   ROM: 15h 16m with clear fluid GBS Status: Unknown, PCN ppx   Maximum Maternal Temperature: 99.7  Labor Progress: Initial SVE: 2/50. She then progressed to complete.   Delivery Date/Time: 01/25/2023 @0552  Delivery: Called to room and patient was complete and pushing. Head delivered OA. No nuchal cord present. Shoulder and body delivered in usual fashion. Infant with spontaneous cry, placed on mother's abdomen, dried and stimulated. Cord clamped x 2 after 1-minute delay, and cut by MOB. Cord blood drawn. Placenta delivered spontaneously with gentle cord traction. Fundus firm with massage and Pitocin. Labia, perineum, vagina, and cervix inspected. There were no lacerations. There was brisk uterine bleeding immediately following delivery that improved after TXA and additional fundal massage.   Baby Weight: pending  Placenta: 3 vessel, intact. Sent to L&D Complications: None Lacerations: None EBL: 585 mL Analgesia: Epidural   Infant:  APGAR (1 MIN): 9   APGAR (5 MINS): 9    [name], DO OB Fellow, Faculty [facility], [facility] [employer] 01/25/2023, 6:17 AM

## 2022-08-01 ENCOUNTER — Telehealth (INDEPENDENT_AMBULATORY_CARE_PROVIDER_SITE_OTHER): Payer: Medicaid Other

## 2022-08-01 DIAGNOSIS — Z3689 Encounter for other specified antenatal screening: Secondary | ICD-10-CM

## 2022-08-01 DIAGNOSIS — Z3480 Encounter for supervision of other normal pregnancy, unspecified trimester: Secondary | ICD-10-CM | POA: Insufficient documentation

## 2022-08-01 MED ORDER — BLOOD PRESSURE MONITORING DEVI: 1.0000 | 0 refills | Status: DC

## 2022-08-01 NOTE — Progress Notes (Addendum)
New OB Intake  I connected with Sue Lee   on 08/01/22 at  3:15 PM EST by MyChart Video Visit and verified that I am speaking with the correct person using two identifiers. Nurse is located at [facility] and pt is located at home.  I discussed the limitations, risks, security and privacy concerns of performing an evaluation and management service by telephone and the availability of in person appointments. I also discussed with the patient that there may be a patient responsible charge related to this service. The patient expressed understanding and agreed to proceed.  I explained I am completing New OB Intake today. We discussed EDD of 02/10/23 that is based on LMP of 05/06/22. Pt is G4/P2. I reviewed her allergies, medications, Medical/Surgical/OB history, and appropriate screenings. I informed her of [facility] services. [facility] information placed in AVS. Based on history, this is a low risk pregnancy.  There are no problems to display for this patient.   Concerns addressed today  Delivery Plans Plans to deliver at [facility]. Patient given information for [facility] Healthy Baby website for more information about [facility] [place]. Patient is not interested in water birth. Offered upcoming OB visit with CNM to discuss further.  MyChart/Babyscripts MyChart access verified. I explained pt will have some visits in office and some virtually. Babyscripts instructions given and order placed. Patient verifies receipt of registration text/e-mail. Account successfully created and app downloaded.  Blood Pressure Cuff/Weight Scale Blood pressure cuff ordered for patient to pick-up from [employer]. Explained after first prenatal appt pt will check weekly and document in 74. Patient does not have weight scale; order sent to [place], patient may track weight weekly in Babyscripts.  Anatomy US Explained first scheduled [place] will be around 19 weeks. Anatomy US scheduled for 09/16/22 at  0945a. Pt notified to arrive at 0930a.  Labs Discussed Johnsie Cancel genetic screening with patient. Would like both Panorama and Horizon drawn at new OB visit. Routine prenatal labs needed.  COVID Vaccine Patient has not had COVID vaccine.   Is patient a CenteringPregnancy candidate?  Declined due to  NA   Is patient a Mom+Baby Combined Care candidate?  Not a candidate     Social Determinants of Health Food Insecurity: Patient denies food insecurity. WIC Referral: Patient is interested in referral to [facility].  Transportation: Patient denies transportation needs. Childcare: Discussed no children allowed at ultrasound appointments. Offered childcare services; patient declines childcare services at this time.  First visit review I reviewed new OB appt with patient. I explained they will have a provider visit that includes . Explained pt will be seen by [name], CNM at first visit; encounter routed to appropriate provider. Explained that patient will be seen by pregnancy navigator following visit with provider.   Bethanne Ginger, [place] 08/01/2022  3:19 PM

## 2022-08-01 NOTE — Patient Instructions (Signed)
AREA PEDIATRIC/FAMILY PRACTICE PHYSICIANS  Central/Southeast [place] ([address]) [facility] Chambliss, MD; Eniola, MD; Hale, MD; Hensel, MD; McDiarmid, MD; McIntyer, MD; Curry Seefeldt, MD; Walden, MD [address] [place], [place] [address] [phone] Mon-Fri 8:30-12:30, 1:30-5:00 Providers come to see babies at [facility] Accepting Medicaid [facility] providers who accept newborns: Koirala, MD; Morrow, MD; Wolters, MD [address], [place], [place] [address] [phone] Mon-Fri 8:00-5:30 Babies seen by providers at [facility] Does NOT accept Medicaid Please call early in hospitalization for appointment (limited availability)  [facility] Mulberry, MD [address] [place], [place] [address] [phone] Mon, Tue, Thur, Fri 8:30-5:00, Wed 10:00-7:00 (closed 1-2pm) Babies seen by [facility] providers Accepting Medicaid [facility] - Pediatrician Rubin, MD [address], [place], [place] [address] [phone] Mon-Fri 8:30-5:00, Sat 8:30-12:00 Provider comes to see babies at [facility] Accepting Medicaid Must have been referred from current patients or contacted office prior to delivery [facility] ([facility]) Brown, MD; Chandler, MD; Ettefagh, MD; Grant, MD; Lester, MD; McCormick, MD; McQueen, MD; Prose, MD; Simha, MD; Stanley, MD; Stryffeler, NP; Tebben, NP [address], [place], [place] [address] [phone] Mon, Tue, Thur, Fri 8:30-5:30, Wed 9:30-5:30, Sat 8:30-12:30 Babies seen by [facility] providers Accepting Medicaid Only accepting infants of first-time parents or siblings of current patients Hospital discharge coordinator will make follow-up appointment Jack Amos [address], [place], [place]  [address] [phone]   Fax - [phone] [facility] [address], [place], [place]  [address] Phone - [phone]   Fax - [phone] Shilpa Gosrani [address], [place], [place]  [address] [phone]  East/Northeast [place] ([address]) [facility] Bates, MD; Brassfield, MD; Cooper, Cox, MD; MD; Davis, MD; Dovico, MD; Ettefaugh, MD; Little, MD; Lowe, MD; Keiffer, MD; Melvin, MD; Sumner, MD; Williams, MD [address], [place], [place] [address] [phone] Mon-Fri 8:30-5:00 (extended evenings Mon-Thur as needed), Sat-Sun 10:00-1:00 Providers come to see babies at [facility] Accepting Medicaid for families of first-time babies and families with all children in the household age 3 and under. Must register with office prior to making appointment (M-F only). [facility] Henson, NP; Knapp, MD; Lalonde, MD; Tysinger, PA [address] [place], [place] [address] [phone] Mon-Fri 8:00-5:00 Babies seen by providers at [facility] Does NOT accept Medicaid/Commercial Insurance Only [facility] ([facility])  Artis, MD; Barnes, MD; Bratton, MD; Coccaro, MD; [place] Gardner, MD; Kramer, MD; Marshall, MD; Netherton, MD; Poleto, MD; Skinner, MD [address] [place], [place] [address] [phone] Mon-Fri 8:30-5:30, Sat (Oct.-Mar.) 9:00-1:00 Babies seen by providers at [facility] Accepting Medicaid  [place] ([address]) [facility] Reid, MD; Warner, MD [address], [place], [place] [address] [phone] Mon-Fri 8:30-5:00, Sat 8:30-12:00 Providers come to see babies at [facility] Does NOT accept Medicaid [facility] Becker, PA; Hagler, MD; Scifres, PA; Sun, MD; Swayne, MD [address], [place],  [address] [phone] Mon-Fri 8:00-5:00 Babies seen by providers at [facility] Does NOT accept Medicaid Only accepting babies of parents who  are patients Please call early in hospitalization for appointment (limited availability) [facility] Papania, MD; Frye, MD; Kelleher, MD; Mack, NP; Miller, MD; O'Keller, MD; Patterson, NP; Pudlo, MD; Puzio, MD; Thomas, MD; Tucker, MD; Twiselton, MD [address], [place], [place] [address] [phone] Mon-Fri 8:00-5:00, Sat 9:00-12:00 Providers come to see babies at [facility] Does NOT accept Medicaid  [place] ([address]) [facility] providers accepting new patients: Brake, NP; Wharton, PA [address], [place], [place] [address] [phone] Mon-Fri 8:00-5:00 Babies seen by providers at [facility] Does NOT accept Medicaid Only accepting babies of parents who are patients Please call early in hospitalization for appointment (limited availability) [facility] Gay, MD; Quinlan, MD [address] [place], [place] [address] [phone] (press 1 to schedule appointment) Mon-Fri 8:00-5:00 Providers come to see babies at [facility] Does NOT accept Medicaid [facility] Mazer, MD [address] [place], [place] [address] [phone] Mon-Fri 8:30-5:00 (lunch 12:30-1:00), extended hours by appointment only Wed 5:00-6:30 Babies seen by [facility] providers Accepting Medicaid [facility] Banks, MD; Jordan, MD; Koberlein, MD [address], [place], [place] [address] [phone] Mon-Fri 8:00-5:00 Babies seen by [facility] providers Does NOT accept Medicaid [facility] Parker, MD; Hunter, MD; Wallace, DO [address] [place], [place] [address] [phone] Mon-Fri 8:00-5:00 Babies seen by [facility] providers Does NOT accept Medicaid [facility] Brandon, PA; Brecken, PA; Christy, NP; Dees, MD; DeClaire, MD; DeWeese, MD; Hansen, NP; Mills, NP; Parrish, NP; Smoot, NP; Summer, MD; Vapne,  MD [address] [place], [place] [address] [phone] Mon-Fri 8:30-5:00, Sat 10:00-1:00 Providers come to see babies at [facility] Does NOT accept Medicaid Free prenatal information session Tuesdays at 4:45pm [facility] Bouska, MD; [place], PA; Jeffery, PA; Weber, PA [address] [place] [address] [phone] Mon-Fri 7:30-5:30 Babies seen by [facility] providers [facility] [address], [place], [place]  [address] [phone]   Fax - [phone]  [place] ([address] & [address]) [facility] [place] [address] [place], [place] [address] [phone] Mon-Thur 8:00-6:00 Providers come to see babies at [facility] Accepting Medicaid [facility] Anderson, NP; Badger, MD; Beal, PA; Spencer, PA [address] [place], [place] [address] [phone] Mon-Thur 7:30-7:30, Fri 7:30-4:30 Babies seen by [facility] providers Accepting Medicaid [facility] Agbuya, MD; Klett, NP; Romgoolam, MD [address], [place], [place] [address] [phone] Mon-Fri 8:30-5:00, Sat 8:30-12:00 Providers come to see babies at [facility] Accepting Medicaid Must have "Meet & Greet" appointment at office prior to delivery [facility] ([facility]) McCord, MD; Wallace, MD; Wood, MD [address], [place], [place] [address] [phone] Mon-Wed 8:00-6:00, Thur-Fri 8:00-5:00, Sat 9:00-12:00 Providers come to see babies at [facility] Does NOT accept Medicaid Only accepting siblings of current patients [facility]  [address], [place], [place]  [address] [phone]   Fax - [phone] [facility] [address], [place], [place]  [address] [phone]   Fax -  [phone]  [place] ([address] & [address]) [facility] Cirigliano, DO; Matthews, DO [address] [place], [place] [address] [phone] Mon-Fri 7:00-5:00 Babies seen by [facility] providers Does NOT accept Medicaid [facility] Briscoe, MD; Howley, PA; Moreira, PA [address], [place], [place] [address] [phone] Mon-Fri 8:00-5:00 Babies seen by [facility] providers Accepting Medicaid [facility] Boyd, MD; Church, PA; Jones, NP; Osborn, PA [address], [place],  [address] [phone] Mon-Fri 8:00-5:00 Babies seen by providers at [facility] Accepting Medicaid  [place] ([address]) [facility] [place] Wendling, DO [address] [place], [place] [address] [phone] Mon-Fri 8:00-5:00 Babies seen by [facility] providers Does NOT accept Medicaid Limited availability, please call early in hospitalization to schedule follow-up [facility] Calderon, PA; Cummings, MD; Dillard, MD; Martin, PA; Olson, MD; VanDeven, PA [address], [place], [place] [address] [phone] Mon-Fri 8:30-5:00, Sat 9:00-12:00 Babies seen by providers at [facility] Accepting Medicaid Please register online then schedule online or call office [email or web address] Hunter, NP; Kumar, MD; Martin Rogers, PA [address], [place], [place] [address] [phone] Mon-Fri 8:00-5:00 Babies seen by providers at [facility] Accepting Medicaid [facility] ([facility]) Tigard, MD; Kristi Fleenor, NP; West, MD [address], [place], [place] [address] [phone] Mon-Fri 8:00-5:30, Sat&Sun by appointment (phones open at  8:30) Babies seen by [facility] providers Accepting Medicaid Must be a first-time baby or sibling of current patient [facility] [place]  [address], [place], [place]  [address] [phone]   Fax - [phone]  [place] ([address]) [facility] Brown, PA; Cowen, PA; Rice, MD; Helton, PA; Spry, MD [address] [place], [place] [address] [phone] Mon-Thur 8:00-7:00, Fri 8:00-5:00, Sat 8:00-12:00, Sun 9:00-12:00 Babies seen by [facility] providers Accepting Medicaid [facility] Coe-Goins, MD; Marshall, MD; Pierre-Louis, MD [address], [place], [place] [address] [phone] Mon-Thur 8:00-5:00 Babies seen by providers at [facility] Accepting Medicaid [facility] Bratton, MD; Coe-Goins, MD; Hayes, MD; Lewis, MD; List, MD; Lott, MD; Marshall, MD; Moran, MD; O'Abbagale Goguen, MD; Pierre-Louis, MD; Pitonzo, MD; Scholer, MD; Spangle, MD [address] [place], [place] [address] [phone] Mon-Fri 8:00-5:30, Sat (Oct.-Mar.) 9:00-1:00 Babies seen by providers at [facility] Accepting Medicaid Must fill out new patient packet, available online at [email or web address] [facility] ([facility]) Friddle, NP; Harris, NP; Kelly, NP; Logan, MD; Melvin, PA; Poth, MD; Ramadoss, MD; Stanton, NP [address], [place], [place] [address] [phone] Mon-Thur 8:00-5:30, Fri 8:00-5:00 Babies seen by providers at [facility] Accepting Medicaid  [place] ([address]) [facility] Dixon, PA; [place], MD; Pickard, MD; Tapia, PA [address], [place], [place] [address] [phone] Mon-Fri 8:00-5:00 Babies seen by providers at [facility] Accepting Medicaid   [place] ([address]) [facility] [place] Masneri, DO; Meyers, MD; Nelson, PA [address], [place], [place] [address] [phone] Mon-Fri 8:00-5:00 Babies seen by providers at [facility] Does NOT accept Medicaid Limited appointment availability, please call early in hospitalization   [facility] [place] Kunedd, DO; McGowen, MD [address], [place], [place] [address] [phone] Mon-Fri 8:00-5:00 Babies seen by [facility] providers Does NOT accept Medicaid [facility] [place] Cameron, MD; MacDonald, MD; Michaels, PA; Nayak, MD [address] [place] [address], [place], [place] [address] [phone] Mon-Fri 8:00-5:00 After hours clinic ([address] [place], [place] [address]) [phone] Mon-Fri 5:00-8:00, Sat 12:00-6:00, Sun 10:00-4:00 Babies seen by [facility] providers Accepting Medicaid [facility] [place] [address], [place], [place]  [address] [phone]   Fax - [phone]  [place] ([address]) [facility] [place] [facility] Andy, MD [address], [place], [place] [address] [phone] Mon-Fri 8:00-5:00 Babies seen by [facility] providers Does NOT accept Medicaid [facility] [place] ([facility] [place]) Eksir, MD [address], [place], [place] [address] [phone] Mon-Thur 8:00-7:00, Fri 8:00-5:00, Sat 8:00-12:00 Babies seen by providers at [facility] Accepting Medicaid - but does not have vaccinations in office (must be received elsewhere) Limited availability, please call early in hospitalization  [place] ([address]) [facility]  Charlene Flemming, MD [address], [place] [address] [phone]  Fax [phone]  [facility]  Kimberly Newton, MD, Annamarie Streilein, PA, Carla Hampton, PA [address] [place] [place], [place]  [address] [phone] [facility]  [address], [place], [place] [address] [phone] [address], [place], [place] [address] [phone] (West Office)  [facility] [address], [place], [place] [address] [phone] [facility] [address], [place], [place] [address] [phone] [facility] [address], [place], [place] [address] [phone] [facility] [address], [place], [place] [address] [phone] [facility] [address], [place], [place] [address] [phone] [facility] [address], [place], [place] [address] [phone] [facility]  [address], [place], [place] [address] [phone] Dr[facility] [address] [place] [address], [place], [place] [address] [phone] [facility] [address], [place], [place] [address] [phone] [facility] [address] [place] [address], , [place] [address] [phone]  

## 2022-08-22 ENCOUNTER — Encounter: Payer: Self-pay | Admitting: Certified Nurse Midwife

## 2022-08-28 ENCOUNTER — Telehealth: Payer: Self-pay | Admitting: *Deleted

## 2022-08-28 NOTE — Telephone Encounter (Signed)
I called to remind patient of upcoming appointments for prenatal care and reached her voicemail. I left message I am calling to remind you of upcoming appointments on 09/09/22 and 09/11/22 and to give you some information- since I have not reached you - I will call again. Staci Acosta

## 2022-09-02 ENCOUNTER — Telehealth: Payer: Self-pay | Admitting: *Deleted

## 2022-09-02 NOTE — Telephone Encounter (Signed)
I called Sue Lee to remind her of her upcoming new ob visit 09/09/22 and then beginning [name] prenatal care 2/14. I reviewed guidelines. She voices understanding and thinks she will be at both appointments. Staci Acosta

## 2022-09-09 ENCOUNTER — Other Ambulatory Visit (HOSPITAL_COMMUNITY)
Admission: RE | Admit: 2022-09-09 | Discharge: 2022-09-09 | Disposition: A | Discharge status: Home / Self Care | Payer: Medicaid Other | Source: Ambulatory Visit | Attending: Certified Nurse Midwife | Admitting: Certified Nurse Midwife

## 2022-09-09 ENCOUNTER — Other Ambulatory Visit: Payer: Self-pay

## 2022-09-09 ENCOUNTER — Ambulatory Visit (INDEPENDENT_AMBULATORY_CARE_PROVIDER_SITE_OTHER): Payer: Medicaid Other | Admitting: Obstetrics and Gynecology

## 2022-09-09 ENCOUNTER — Encounter: Payer: Self-pay | Admitting: Obstetrics and Gynecology

## 2022-09-09 VITALS — BP 114/72 | HR 87 | Wt 119.3 lb

## 2022-09-09 DIAGNOSIS — Z3480 Encounter for supervision of other normal pregnancy, unspecified trimester: Secondary | ICD-10-CM | POA: Diagnosis present

## 2022-09-09 DIAGNOSIS — Z3482 Encounter for supervision of other normal pregnancy, second trimester: Secondary | ICD-10-CM | POA: Diagnosis not present

## 2022-09-09 DIAGNOSIS — Z3A18 18 weeks gestation of pregnancy: Secondary | ICD-10-CM

## 2022-09-09 DIAGNOSIS — O0932 Supervision of pregnancy with insufficient antenatal care, second trimester: Secondary | ICD-10-CM | POA: Diagnosis not present

## 2022-09-09 DIAGNOSIS — N83292 Other ovarian cyst, left side: Secondary | ICD-10-CM | POA: Insufficient documentation

## 2022-09-09 NOTE — Progress Notes (Signed)
New OB Note  09/09/2022   Clinic: [facility]  Chief Complaint: New OB  Transfer of Care Patient: no  History of Present Illness: Ms. Shellito is a 27 y.o. [id number] @ 18/0 weeks ([place] 7/15 [tentative], based on Patient's last menstrual period was 05/06/2022.).  Preg complicated by has Supervision of other normal pregnancy, antepartum and Complex cyst of left ovary on their problem list.   No issues or concerns.  Periods were qmonth, regular  ROS: A 12-point review of systems was performed and negative, except as stated in the above HPI.  OBGYN History: As per HPI. OB History  Gravida Para Term Preterm AB Living  4 2 2 $ 0 1 2  SAB IAB Ectopic Multiple Live Births  1 0 0   2    # Outcome Date GA Lbr Len/2nd Weight Sex Delivery Anes PTL Lv  4 Current           3 SAB 2020          2 Term 02/29/16    M Vag-Spont   LIV  1 Term 02/13/14    M Vag-Spont   LIV   Any issues with any prior pregnancies: no Prior children are healthy, doing well, and without any problems or issues: yes   Past Medical History: Past Medical History:  Diagnosis Date   Medical history non-contributory     Past Surgical History: Past Surgical History:  Procedure Laterality Date   WISDOM TOOTH EXTRACTION      Family History:  Family History  Problem Relation Age of Onset   Healthy Mother    Healthy Father    She denies any history of mental retardation, birth defects or genetic disorders in her or the FOB's history  Social History:  Social History   Socioeconomic History   Marital status: Single    Spouse name: Not on file   Number of children: Not on file   Years of education: Not on file   Highest education level: Not on file  Occupational History   Not on file  Tobacco Use   Smoking status: Every Day    Types: Cigarettes    Passive exposure: Never   Smokeless tobacco: Never  Vaping Use   Vaping Use: Never used  Substance and Sexual Activity   Alcohol  use: Never   Drug use: Never   Sexual activity: Yes    Birth control/protection: None  Other Topics Concern   Not on file  Social History Narrative   ** Merged History Encounter **       Social Determinants of Health   Financial Resource Strain: Not on file  Food Insecurity: Food Insecurity Present (09/09/2022)   Hunger Vital Sign    Worried About Running Out of Food in the Last Year: Sometimes true    Ran Out of Food in the Last Year: Sometimes true  Transportation Needs: No Transportation Needs (09/09/2022)   PRAPARE - Hydrologist (Medical): No    Lack of Transportation (Non-Medical): No  Physical Activity: Not on file  Stress: Not on file  Social Connections: Not on file  Intimate Partner Violence: Not on file   Allergy: No Known Allergies  Current Outpatient Medications: Prenatal vitamin  Physical Exam:   BP 114/72   Pulse 87   Wt 119 lb 4.8 oz (54.1 kg)   LMP 05/06/2022   BMI 21.82 kg/m  Body mass index is 21.82 kg/m. Contractions: Not present  Vag. Bleeding: None. Fundal height: not applicable FHTs: 0000000  General appearance: Well nourished, well developed female in no acute distress.  Neck:  Supple, normal appearance, and no thyromegaly  Cardiovascular: S1, S2 normal, no murmur, rub or gallop, regular rate and rhythm Respiratory:  Clear to auscultation bilateral. Normal respiratory effort Abdomen: positive bowel sounds and no masses, hernias; diffusely non tender to palpation, non distended Breasts: negative s/s. Neuro/Psych:  Normal mood and affect.  Skin:  Warm and dry.  Lymphatic:  No inguinal lymphadenopathy.   Pelvic exam: is not limited by body habitus EGBUS: within normal limits, Vagina: within normal limits and with no blood in the vault, Cervix: normal appearing cervix without discharge or lesions, closed/long/high, Uterus:  enlarged, c/w 18 week size, and Adnexa:  normal adnexa and no mass, fullness,  tenderness  Laboratory: None  Imaging:  Patient denies any u/s yet this pregnancy  Assessment: patient doing well  Plan: 1. Complex cyst of left ovary Seen in GYN u/s April 2023; f/u at anatomy u/s on 2/19  2. [redacted] weeks gestation of pregnancy Routine care - Hemoglobin A1c - Culture, OB Urine - AFP, Serum, Open Spina Bifida - CBC/D/Plt+RPR+Rh+ABO+RubIgG... - Cytology - PAP( [place])  3. Supervision of other normal pregnancy, antepartum - Hemoglobin A1c - Culture, OB Urine - AFP, Serum, Open Spina Bifida - CBC/D/Plt+RPR+Rh+ABO+RubIgG... - Cytology - PAP( [place])  Problem list reviewed and updated.  Follow up in 4 weeks.  The nature of [place] with multiple MDs and other Advanced Practice Providers was explained to patient; also emphasized that residents, students are part of our team.  >50% of 30 min visit spent on counseling and coordination of care.     Durene Romans MD Attending Center for [place] [facility])

## 2022-09-11 ENCOUNTER — Encounter: Payer: Medicaid Other | Admitting: Advanced Practice Midwife

## 2022-09-11 LAB — CYTOLOGY - PAP
Chlamydia: NEGATIVE
Comment: NEGATIVE
Comment: NEGATIVE
Comment: NORMAL
Diagnosis: NEGATIVE
High risk HPV: NEGATIVE
Neisseria Gonorrhea: NEGATIVE

## 2022-09-11 LAB — CULTURE, OB URINE

## 2022-09-11 LAB — URINE CULTURE, OB REFLEX: Organism ID, Bacteria: NO GROWTH

## 2022-09-12 ENCOUNTER — Encounter: Payer: Self-pay | Admitting: *Deleted

## 2022-09-12 ENCOUNTER — Telehealth: Payer: Self-pay | Admitting: *Deleted

## 2022-09-12 NOTE — Telephone Encounter (Signed)
Telsa missed her prenatal visit yesterday with [facility]. Per discussion with provider since she has been seen recently she can continue with scheduled visits. I called Alisandra and informed her she missed her prenatal appointment. I reviewed her next appointment 10/09/22 and she states she should make the next one. She states she is fine.  Staci Acosta

## 2022-09-13 ENCOUNTER — Encounter: Payer: Self-pay | Admitting: Obstetrics and Gynecology

## 2022-09-13 DIAGNOSIS — O09899 Supervision of other high risk pregnancies, unspecified trimester: Secondary | ICD-10-CM

## 2022-09-13 DIAGNOSIS — Z2839 Other underimmunization status: Secondary | ICD-10-CM | POA: Insufficient documentation

## 2022-09-13 HISTORY — DX: Supervision of other high risk pregnancies, unspecified trimester: O09.899

## 2022-09-13 LAB — AFP, SERUM, OPEN SPINA BIFIDA
AFP MoM: 1.07
AFP Value: 60.5 ng/mL
Gest. Age on Collection Date: 18 weeks
Maternal Age At EDD: 28.4 yr
OSBR Risk 1 IN: 10000
Test Results:: NEGATIVE
Weight: 119 [lb_av]

## 2022-09-13 LAB — CBC/D/PLT+RPR+RH+ABO+RUBIGG...
Antibody Screen: NEGATIVE
Basophils Absolute: 0 10*3/uL (ref 0.0–0.2)
Basos: 0 %
EOS (ABSOLUTE): 0.1 10*3/uL (ref 0.0–0.4)
Eos: 1 %
HCV Ab: NONREACTIVE
HIV Screen 4th Generation wRfx: NONREACTIVE
Hematocrit: 33.9 % — ABNORMAL LOW (ref 34.0–46.6)
Hemoglobin: 11.6 g/dL (ref 11.1–15.9)
Hepatitis B Surface Ag: NEGATIVE
Immature Grans (Abs): 0 10*3/uL (ref 0.0–0.1)
Immature Granulocytes: 0 %
Lymphocytes Absolute: 2.2 10*3/uL (ref 0.7–3.1)
Lymphs: 21 %
MCH: 30.7 pg (ref 26.6–33.0)
MCHC: 34.2 g/dL (ref 31.5–35.7)
MCV: 90 fL (ref 79–97)
Monocytes Absolute: 0.5 10*3/uL (ref 0.1–0.9)
Monocytes: 5 %
Neutrophils Absolute: 7.8 10*3/uL — ABNORMAL HIGH (ref 1.4–7.0)
Neutrophils: 73 %
Platelets: 285 10*3/uL (ref 150–450)
RBC: 3.78 x10E6/uL (ref 3.77–5.28)
RDW: 13.3 % (ref 11.7–15.4)
RPR Ser Ql: NONREACTIVE
Rh Factor: POSITIVE
Rubella Antibodies, IGG: <0.9 index — ABNORMAL LOW (ref >0.99)
WBC: 10.6 10*3/uL (ref 3.4–10.8)

## 2022-09-13 LAB — PANORAMA PRENATAL TEST FULL PANEL:PANORAMA TEST PLUS 5 ADDITIONAL MICRODELETIONS: FETAL FRACTION: 14.7

## 2022-09-13 LAB — HEMOGLOBIN A1C
Est. average glucose Bld gHb Est-mCnc: 114 mg/dL
Hgb A1c MFr Bld: 5.6 % (ref 4.8–5.6)

## 2022-09-13 LAB — HCV INTERPRETATION

## 2022-09-15 LAB — HORIZON CUSTOM: REPORT SUMMARY: NEGATIVE

## 2022-09-16 ENCOUNTER — Encounter: Payer: Self-pay | Admitting: *Deleted

## 2022-09-16 ENCOUNTER — Ambulatory Visit: Payer: Medicaid Other | Attending: Certified Nurse Midwife

## 2022-09-16 ENCOUNTER — Ambulatory Visit: Payer: Medicaid Other | Admitting: *Deleted

## 2022-09-16 ENCOUNTER — Other Ambulatory Visit: Payer: Self-pay | Admitting: *Deleted

## 2022-09-16 VITALS — BP 117/56 | HR 85

## 2022-09-16 DIAGNOSIS — O321XX0 Maternal care for breech presentation, not applicable or unspecified: Secondary | ICD-10-CM | POA: Insufficient documentation

## 2022-09-16 DIAGNOSIS — Z3A17 17 weeks gestation of pregnancy: Secondary | ICD-10-CM | POA: Diagnosis not present

## 2022-09-16 DIAGNOSIS — O3482 Maternal care for other abnormalities of pelvic organs, second trimester: Secondary | ICD-10-CM | POA: Insufficient documentation

## 2022-09-16 DIAGNOSIS — O0932 Supervision of pregnancy with insufficient antenatal care, second trimester: Secondary | ICD-10-CM | POA: Diagnosis not present

## 2022-09-16 DIAGNOSIS — Z3689 Encounter for other specified antenatal screening: Secondary | ICD-10-CM | POA: Insufficient documentation

## 2022-09-16 DIAGNOSIS — N83209 Unspecified ovarian cyst, unspecified side: Secondary | ICD-10-CM | POA: Diagnosis not present

## 2022-09-16 DIAGNOSIS — Z3480 Encounter for supervision of other normal pregnancy, unspecified trimester: Secondary | ICD-10-CM

## 2022-09-16 DIAGNOSIS — Z3492 Encounter for supervision of normal pregnancy, unspecified, second trimester: Secondary | ICD-10-CM

## 2022-10-01 ENCOUNTER — Telehealth: Payer: Self-pay | Admitting: *Deleted

## 2022-10-01 ENCOUNTER — Encounter: Payer: Self-pay | Admitting: *Deleted

## 2022-10-01 NOTE — Telephone Encounter (Signed)
I called and left a message re: her next prenatal appointment on 10/08/21. I will also send mychart message. Staci Acosta

## 2022-10-08 ENCOUNTER — Telehealth: Payer: Self-pay | Admitting: *Deleted

## 2022-10-08 ENCOUNTER — Encounter: Payer: Self-pay | Admitting: *Deleted

## 2022-10-08 NOTE — Telephone Encounter (Signed)
In prepartation for Centering[facility]regnancy visits for 10/09/22 noted she had cancelled her 10/09/22 appointment and placed note she is working 3rd shift and can't do these appointments. I called and left a message I was calling regarding her appointment and will send a detailed MyChart message- please read and respond. Staci Acosta

## 2022-10-09 ENCOUNTER — Telehealth: Payer: Self-pay | Admitting: *Deleted

## 2022-10-09 NOTE — Telephone Encounter (Signed)
Had sent MyChart message to Sue Lee to clarify if she was cancelling Centering appointment for 3/13 only or all. She has not read or responded. I called and left message I was calling about her appointments and had sent a mychart message- please read and respond. Staci Acosta

## 2022-10-10 ENCOUNTER — Encounter: Payer: Medicaid Other | Admitting: Advanced Practice Midwife

## 2022-10-15 ENCOUNTER — Ambulatory Visit: Payer: Medicaid Other | Attending: Certified Nurse Midwife

## 2022-10-15 ENCOUNTER — Ambulatory Visit: Payer: Medicaid Other | Admitting: *Deleted

## 2022-10-15 VITALS — BP 111/58 | HR 89

## 2022-10-15 DIAGNOSIS — O3482 Maternal care for other abnormalities of pelvic organs, second trimester: Secondary | ICD-10-CM | POA: Insufficient documentation

## 2022-10-15 DIAGNOSIS — Z3480 Encounter for supervision of other normal pregnancy, unspecified trimester: Secondary | ICD-10-CM | POA: Diagnosis present

## 2022-10-15 DIAGNOSIS — N83209 Unspecified ovarian cyst, unspecified side: Secondary | ICD-10-CM | POA: Insufficient documentation

## 2022-10-15 DIAGNOSIS — Z3A21 21 weeks gestation of pregnancy: Secondary | ICD-10-CM | POA: Insufficient documentation

## 2022-10-15 DIAGNOSIS — N83299 Other ovarian cyst, unspecified side: Secondary | ICD-10-CM

## 2022-10-15 DIAGNOSIS — O0932 Supervision of pregnancy with insufficient antenatal care, second trimester: Secondary | ICD-10-CM | POA: Insufficient documentation

## 2022-10-15 DIAGNOSIS — Z3492 Encounter for supervision of normal pregnancy, unspecified, second trimester: Secondary | ICD-10-CM

## 2022-11-05 ENCOUNTER — Encounter: Payer: Self-pay | Admitting: *Deleted

## 2022-11-05 ENCOUNTER — Telehealth: Payer: Self-pay | Admitting: Family Medicine

## 2022-11-05 ENCOUNTER — Telehealth: Payer: Self-pay | Admitting: *Deleted

## 2022-11-05 NOTE — Telephone Encounter (Signed)
Sue Lee called front desk and asked to be removed from [facility]. 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 They scheduled her a traditional visit. Nancy Fetter

## 2022-11-05 NOTE — Telephone Encounter (Signed)
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

## 2022-11-22 ENCOUNTER — Ambulatory Visit (INDEPENDENT_AMBULATORY_CARE_PROVIDER_SITE_OTHER): Payer: Medicaid Other | Admitting: Family Medicine

## 2022-11-22 ENCOUNTER — Other Ambulatory Visit: Payer: Self-pay

## 2022-11-22 VITALS — Wt 137.3 lb

## 2022-11-22 DIAGNOSIS — Z3A27 27 weeks gestation of pregnancy: Secondary | ICD-10-CM

## 2022-11-22 DIAGNOSIS — Z2839 Other underimmunization status: Secondary | ICD-10-CM

## 2022-11-22 DIAGNOSIS — O09892 Supervision of other high risk pregnancies, second trimester: Secondary | ICD-10-CM

## 2022-11-22 DIAGNOSIS — Z3480 Encounter for supervision of other normal pregnancy, unspecified trimester: Secondary | ICD-10-CM

## 2022-11-22 DIAGNOSIS — O09899 Supervision of other high risk pregnancies, unspecified trimester: Secondary | ICD-10-CM

## 2022-11-22 NOTE — Progress Notes (Signed)
   PRENATAL VISIT NOTE  Subjective:  Sue Lee is a 27 y.o. [phone] at [redacted]w[redacted]d being seen today for ongoing prenatal care.  She is currently monitored for the following issues for this low-risk pregnancy and has Supervision of other normal pregnancy, antepartum; Complex cyst of left ovary; Late prenatal care affecting pregnancy in second trimester; and Rubella non-immune status, antepartum on their problem list.  Patient reports no complaints.  Contractions: Not present. Vag. Bleeding: None.  Movement: Present. Denies leaking of fluid.   The following portions of the patient's history were reviewed and updated as appropriate: allergies, current medications, past family history, past medical history, past social history, past surgical history and problem list.   Objective:   Vitals:   11/22/22 0923  Weight: 137 lb 4.8 oz (62.3 kg)    Fetal Status: Fetal Heart Rate (bpm): 154   Movement: Present     General:  Alert, oriented and cooperative. Patient is in no acute distress.  Skin: Skin is warm and dry. No rash noted.   Cardiovascular: Normal heart rate noted  Respiratory: Normal respiratory effort, no problems with respiration noted  Abdomen: Soft, gravid, appropriate for gestational age.  Pain/Pressure: Absent     Pelvic: Cervical exam deferred        Extremities: Normal range of motion.  Edema: None  Mental Status: Normal mood and affect. Normal behavior. Normal judgment and thought content.   Assessment and Plan:  Pregnancy: [id number] at [redacted]w[redacted]d 1. Supervision of other normal pregnancy, antepartum FHT and FH  2. Rubella non-immune status, antepartum MMR  Preterm labor symptoms and general obstetric precautions including but not limited to vaginal bleeding, contractions, leaking of fluid and fetal movement were reviewed in detail with the patient. Please refer to After Visit Summary for other counseling recommendations.   No follow-ups on file.  No future appointments.  Levie Heritage, DO

## 2022-12-05 ENCOUNTER — Other Ambulatory Visit: Payer: Medicaid Other

## 2022-12-05 ENCOUNTER — Other Ambulatory Visit: Payer: Self-pay

## 2022-12-05 ENCOUNTER — Ambulatory Visit (INDEPENDENT_AMBULATORY_CARE_PROVIDER_SITE_OTHER): Payer: Medicaid Other | Admitting: Family Medicine

## 2022-12-05 VITALS — BP 109/72 | HR 104 | Wt 139.1 lb

## 2022-12-05 DIAGNOSIS — Z2839 Other underimmunization status: Secondary | ICD-10-CM

## 2022-12-05 DIAGNOSIS — Z3A29 29 weeks gestation of pregnancy: Secondary | ICD-10-CM

## 2022-12-05 DIAGNOSIS — O09893 Supervision of other high risk pregnancies, third trimester: Secondary | ICD-10-CM

## 2022-12-05 DIAGNOSIS — Z3480 Encounter for supervision of other normal pregnancy, unspecified trimester: Secondary | ICD-10-CM

## 2022-12-05 DIAGNOSIS — L299 Pruritus, unspecified: Secondary | ICD-10-CM

## 2022-12-05 DIAGNOSIS — O09899 Supervision of other high risk pregnancies, unspecified trimester: Secondary | ICD-10-CM

## 2022-12-05 NOTE — Progress Notes (Signed)
Reports generalized itching since start of pregnancy. Has been progressively getting worse. Most severe on arms, neck, behind knees.

## 2022-12-05 NOTE — Progress Notes (Addendum)
   PRENATAL VISIT NOTE  Subjective:  Sue Lee is a 27 y.o. [phone] at [redacted]w[redacted]d being seen today for ongoing prenatal care.  She is currently monitored for the following issues for this low-risk pregnancy and has Supervision of other normal pregnancy, antepartum; Complex cyst of left ovary; Late prenatal care affecting pregnancy in second trimester; and Rubella non-immune status, antepartum on their problem list.  Patient reports no bleeding, no contractions, no cramping, and no leaking.  Contractions: Not present. Vag. Bleeding: None.  Movement: Present. Denies leaking of fluid.  Patient is complaining of itching over her entire body.  Worse at night.  Occasional itching on palms and soles of feet but more frequently increases.  Has not changed detergents or soaps.  The following portions of the patient's history were reviewed and updated as appropriate: allergies, current medications, past family history, past medical history, past social history, past surgical history and problem list.   Objective:   Vitals:   12/05/22 0901  BP: 109/72  Pulse: (!) 104  Weight: 139 lb 1.6 oz (63.1 kg)    Fetal Status: Fetal Heart Rate (bpm): 130   Movement: Present     General:  Alert, oriented and cooperative. Patient is in no acute distress.  Skin: Skin is warm and dry. No rash noted.   Cardiovascular: Normal heart rate noted  Respiratory: Normal respiratory effort, no problems with respiration noted  Abdomen: Soft, gravid, appropriate for gestational age.  Pain/Pressure: Absent     Pelvic: Cervical exam deferred        Extremities: Normal range of motion.  Edema: None  Mental Status: Normal mood and affect. Normal behavior. Normal judgment and thought content.   Assessment and Plan:  Pregnancy: [id number] at [redacted]w[redacted]d 1. Supervision of other normal pregnancy, antepartum Continue routine prenatal care 28-week labs collected today - Glucose Tolerance, 2 Hours w/1 Hour - CBC - HIV antibody (with  reflex) - RPR  2. Rubella non-immune status, antepartum Will need MMR postpartum  3. [redacted] weeks gestation of pregnancy - CBC - HIV antibody (with reflex) - RPR  4. Itching Patient reports itching worse at night but all over, not specifically palms and soles of feet.  Will get baseline CMP and bile acid levels - Comp Met (CMET) - Bile acids, total  Preterm labor symptoms and general obstetric precautions including but not limited to vaginal bleeding, contractions, leaking of fluid and fetal movement were reviewed in detail with the patient. Please refer to After Visit Summary for other counseling recommendations.   No follow-ups on file.  Future Appointments  Date Time Provider Department Center  12/26/2022 10:55 AM Sue Lush, FNP [facility]    Celedonio Savage, MD

## 2022-12-06 LAB — COMPREHENSIVE METABOLIC PANEL
ALT: 10 IU/L (ref 0–32)
AST: 16 IU/L (ref 0–40)
Albumin/Globulin Ratio: 1.4 (ref 1.2–2.2)
Albumin: 3.9 g/dL — ABNORMAL LOW (ref 4.0–5.0)
Alkaline Phosphatase: 79 IU/L (ref 44–121)
BUN/Creatinine Ratio: 14 (ref 9–23)
BUN: 8 mg/dL (ref 6–20)
Bilirubin Total: 0.3 mg/dL (ref 0.0–1.2)
CO2: 19 mmol/L — ABNORMAL LOW (ref 20–29)
Calcium: 8.6 mg/dL — ABNORMAL LOW (ref 8.7–10.2)
Chloride: 102 mmol/L (ref 96–106)
Creatinine, Ser: 0.59 mg/dL (ref 0.57–1.00)
Globulin, Total: 2.7 g/dL (ref 1.5–4.5)
Glucose: 71 mg/dL (ref 70–99)
Potassium: 4 mmol/L (ref 3.5–5.2)
Sodium: 135 mmol/L (ref 134–144)
Total Protein: 6.6 g/dL (ref 6.0–8.5)
eGFR: 127 mL/min/{1.73_m2} (ref >59)

## 2022-12-06 LAB — CBC
Hematocrit: 30.1 % — ABNORMAL LOW (ref 34.0–46.6)
Hemoglobin: 10.2 g/dL — ABNORMAL LOW (ref 11.1–15.9)
MCH: 29.1 pg (ref 26.6–33.0)
MCHC: 33.9 g/dL (ref 31.5–35.7)
MCV: 86 fL (ref 79–97)
Platelets: 255 10*3/uL (ref 150–450)
RBC: 3.51 x10E6/uL — ABNORMAL LOW (ref 3.77–5.28)
RDW: 12.2 % (ref 11.7–15.4)
WBC: 9.7 10*3/uL (ref 3.4–10.8)

## 2022-12-06 LAB — BILE ACIDS, TOTAL: Bile Acids Total: 3.2 umol/L (ref 0.0–10.0)

## 2022-12-06 LAB — HIV ANTIBODY (ROUTINE TESTING W REFLEX): HIV Screen 4th Generation wRfx: NONREACTIVE

## 2022-12-06 LAB — GLUCOSE TOLERANCE, 2 HOURS W/ 1HR
Glucose, 1 hour: 92 mg/dL (ref 70–179)
Glucose, 2 hour: 60 mg/dL — ABNORMAL LOW (ref 70–152)
Glucose, Fasting: 75 mg/dL (ref 70–91)

## 2022-12-06 LAB — RPR: RPR Ser Ql: NONREACTIVE

## 2022-12-09 ENCOUNTER — Encounter: Payer: Self-pay | Admitting: *Deleted

## 2022-12-11 ENCOUNTER — Encounter: Payer: Self-pay | Admitting: *Deleted
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
## 2022-12-26 ENCOUNTER — Other Ambulatory Visit: Payer: Self-pay

## 2022-12-26 ENCOUNTER — Encounter: Payer: Self-pay | Admitting: Obstetrics and Gynecology

## 2022-12-26 ENCOUNTER — Ambulatory Visit (INDEPENDENT_AMBULATORY_CARE_PROVIDER_SITE_OTHER): Payer: Medicaid Other | Admitting: Obstetrics and Gynecology

## 2022-12-26 VITALS — BP 111/60 | HR 95 | Wt 143.6 lb

## 2022-12-26 DIAGNOSIS — Z3480 Encounter for supervision of other normal pregnancy, unspecified trimester: Secondary | ICD-10-CM

## 2022-12-26 DIAGNOSIS — O09899 Supervision of other high risk pregnancies, unspecified trimester: Secondary | ICD-10-CM

## 2022-12-26 DIAGNOSIS — O09893 Supervision of other high risk pregnancies, third trimester: Secondary | ICD-10-CM

## 2022-12-26 DIAGNOSIS — Z3A32 32 weeks gestation of pregnancy: Secondary | ICD-10-CM

## 2022-12-26 DIAGNOSIS — Z2839 Other underimmunization status: Secondary | ICD-10-CM

## 2022-12-26 NOTE — Progress Notes (Signed)
   PRENATAL VISIT NOTE  Subjective:  Sue Lee is a 27 y.o. [phone] at [redacted]w[redacted]d being seen today for ongoing prenatal care.  She is currently monitored for the following issues for this low-risk pregnancy and has Supervision of other normal pregnancy, antepartum; Complex cyst of left ovary; Late prenatal care affecting pregnancy in second trimester; and Rubella non-immune status, antepartum on their problem list.  Patient reports no complaints.  Contractions: Irritability. Vag. Bleeding: None.  Movement: Present. Denies leaking of fluid.   The following portions of the patient's history were reviewed and updated as appropriate: allergies, current medications, past family history, past medical history, past social history, past surgical history and problem list.   Objective:   Vitals:   12/26/22 1122  BP: 111/60  Pulse: 95  Weight: 143 lb 9.6 oz (65.1 kg)    Fetal Status: Fetal Heart Rate (bpm): 135 Fundal Height: 32 cm Movement: Present     General:  Alert, oriented and cooperative. Patient is in no acute distress.  Skin: Skin is warm and dry. No rash noted.   Cardiovascular: Normal heart rate noted  Respiratory: Normal respiratory effort, no problems with respiration noted  Abdomen: Soft, gravid, appropriate for gestational age.  Pain/Pressure: Absent     Pelvic: Cervical exam deferred        Extremities: Normal range of motion.  Edema: None  Mental Status: Normal mood and affect. Normal behavior. Normal judgment and thought content.   Assessment and Plan:  Pregnancy: [id number] at [redacted]w[redacted]d 1. Supervision of other normal pregnancy, antepartum BP and FHR normal FH appropriate Feeling vigorous movement  Discussed supportive measures for intermittent pelvic pain at home  2. Rubella non-immune status, antepartum Offer MMR pp  3. [redacted] weeks gestation of pregnancy Routine prenatal care  Anticipatory guidance for upcoming appts   Preterm labor symptoms and general obstetric  precautions including but not limited to vaginal bleeding, contractions, leaking of fluid and fetal movement were reviewed in detail with the patient. Please refer to After Visit Summary for other counseling recommendations.   Return in 2 weeks for routine prenatal care  Albertine Grates, FNP

## 2023-01-10 ENCOUNTER — Encounter: Payer: Self-pay | Admitting: Family Medicine

## 2023-01-10 ENCOUNTER — Ambulatory Visit (INDEPENDENT_AMBULATORY_CARE_PROVIDER_SITE_OTHER): Payer: Medicaid Other | Admitting: Obstetrics and Gynecology

## 2023-01-10 ENCOUNTER — Other Ambulatory Visit: Payer: Self-pay

## 2023-01-10 VITALS — BP 108/57 | HR 103 | Wt 139.4 lb

## 2023-01-10 DIAGNOSIS — O99013 Anemia complicating pregnancy, third trimester: Secondary | ICD-10-CM

## 2023-01-10 DIAGNOSIS — Z3A34 34 weeks gestation of pregnancy: Secondary | ICD-10-CM

## 2023-01-10 MED ORDER — FERROUS SULFATE 324 MG PO TBEC: 324.0000 mg | DELAYED_RELEASE_TABLET | ORAL | 0 refills | Status: DC

## 2023-01-10 NOTE — Progress Notes (Signed)
   PRENATAL VISIT NOTE  Subjective:  Sue Lee is a 27 y.o. [phone] at [redacted]w[redacted]d being seen today for ongoing prenatal care.  She is currently monitored for the following issues for this low-risk pregnancy and has Supervision of other normal pregnancy, antepartum; Late prenatal care affecting pregnancy in second trimester; and Rubella non-immune status, antepartum on their problem list.  Patient reports no complaints.  Contractions: Irritability. Vag. Bleeding: None.  Movement: Present. Denies leaking of fluid.   The following portions of the patient's history were reviewed and updated as appropriate: allergies, current medications, past family history, past medical history, past social history, past surgical history and problem list.   Objective:   Vitals:   01/10/23 1056  BP: (!) 108/57  Pulse: (!) 103  Weight: 139 lb 6.4 oz (63.2 kg)    Fetal Status: Fetal Heart Rate (bpm): 138   Movement: Present     General:  Alert, oriented and cooperative. Patient is in no acute distress.  Skin: Skin is warm and dry. No rash noted.   Cardiovascular: Normal heart rate noted  Respiratory: Normal respiratory effort, no problems with respiration noted  Abdomen: Soft, gravid, appropriate for gestational age.  Pain/Pressure: Absent     Pelvic: Cervical exam performed in the presence of a chaperone        Extremities: Normal range of motion.  Edema: None  Mental Status: Normal mood and affect. Normal behavior. Normal judgment and thought content.   Assessment and Plan:  Pregnancy: [id number] at [redacted]w[redacted]d 1. Anemia during pregnancy in third trimester Pt amenable to starting qod    Latest Ref Rng & Units 12/05/2022    9:30 AM 09/09/2022   10:31 AM 11/03/2021    3:00 PM  CBC  WBC 3.4 - 10.8 x10E3/uL 9.7  10.6  8.1   Hemoglobin 11.1 - 15.9 g/dL 19.1  47.8  29.5   Hematocrit 34.0 - 46.6 % 30.1  33.9  42.4   Platelets 150 - 450 x10E3/uL 255  285  287      2. [redacted] weeks gestation of pregnancy Gbs next  visit. F/u re: birth control next visit Watch weight. Pt denies any n/v issues.   Preterm labor symptoms and general obstetric precautions including but not limited to vaginal bleeding, contractions, leaking of fluid and fetal movement were reviewed in detail with the patient. Please refer to After Visit Summary for other counseling recommendations.   Return in about 2 weeks (around 01/24/2023) for low risk ob, in person, md or app.  Future Appointments  Date Time Provider Department Center  01/24/2023 10:55 AM Reva Bores, MD [facility]    [place] Bing, MD

## 2023-01-24 ENCOUNTER — Ambulatory Visit (INDEPENDENT_AMBULATORY_CARE_PROVIDER_SITE_OTHER): Payer: Medicaid Other | Admitting: Family Medicine

## 2023-01-24 ENCOUNTER — Other Ambulatory Visit (HOSPITAL_COMMUNITY): Admit: 2023-01-24 | Payer: Medicaid Other

## 2023-01-24 ENCOUNTER — Other Ambulatory Visit (HOSPITAL_COMMUNITY)
Admission: RE | Admit: 2023-01-24 | Discharge: 2023-01-24 | Disposition: A | Discharge status: Home / Self Care | Payer: Medicaid Other | Source: Ambulatory Visit | Attending: Family Medicine | Admitting: Family Medicine

## 2023-01-24 ENCOUNTER — Inpatient Hospital Stay (HOSPITAL_COMMUNITY)
Admission: AD | Admit: 2023-01-24 | Discharge: 2023-01-27 | DRG: 805 | Disposition: A | Discharge status: Home / Self Care | Payer: Medicaid Other | Attending: Obstetrics & Gynecology | Admitting: Obstetrics & Gynecology

## 2023-01-24 ENCOUNTER — Encounter: Payer: Self-pay | Admitting: Family Medicine

## 2023-01-24 ENCOUNTER — Encounter (HOSPITAL_COMMUNITY): Payer: Self-pay | Admitting: Obstetrics & Gynecology

## 2023-01-24 ENCOUNTER — Other Ambulatory Visit: Payer: Self-pay

## 2023-01-24 VITALS — BP 105/65 | HR 94 | Wt 143.4 lb

## 2023-01-24 DIAGNOSIS — Z23 Encounter for immunization: Secondary | ICD-10-CM | POA: Diagnosis not present

## 2023-01-24 DIAGNOSIS — Z2839 Other underimmunization status: Secondary | ICD-10-CM

## 2023-01-24 DIAGNOSIS — Z3480 Encounter for supervision of other normal pregnancy, unspecified trimester: Secondary | ICD-10-CM | POA: Diagnosis not present

## 2023-01-24 DIAGNOSIS — O99824 Streptococcus B carrier state complicating childbirth: Secondary | ICD-10-CM | POA: Diagnosis present

## 2023-01-24 DIAGNOSIS — Z0289 Encounter for other administrative examinations: Secondary | ICD-10-CM

## 2023-01-24 DIAGNOSIS — D649 Anemia, unspecified: Secondary | ICD-10-CM | POA: Diagnosis not present

## 2023-01-24 DIAGNOSIS — O42913 Preterm premature rupture of membranes, unspecified as to length of time between rupture and onset of labor, third trimester: Principal | ICD-10-CM | POA: Diagnosis present

## 2023-01-24 DIAGNOSIS — O9081 Anemia of the puerperium: Secondary | ICD-10-CM | POA: Diagnosis not present

## 2023-01-24 DIAGNOSIS — Z87891 Personal history of nicotine dependence: Secondary | ICD-10-CM

## 2023-01-24 DIAGNOSIS — O09893 Supervision of other high risk pregnancies, third trimester: Secondary | ICD-10-CM

## 2023-01-24 DIAGNOSIS — Z3A36 36 weeks gestation of pregnancy: Secondary | ICD-10-CM

## 2023-01-24 DIAGNOSIS — O0932 Supervision of pregnancy with insufficient antenatal care, second trimester: Secondary | ICD-10-CM

## 2023-01-24 DIAGNOSIS — O42013 Preterm premature rupture of membranes, onset of labor within 24 hours of rupture, third trimester: Secondary | ICD-10-CM | POA: Diagnosis not present

## 2023-01-24 DIAGNOSIS — O0933 Supervision of pregnancy with insufficient antenatal care, third trimester: Secondary | ICD-10-CM

## 2023-01-24 LAB — TYPE AND SCREEN
ABO/RH(D): A POS
Antibody Screen: NEGATIVE

## 2023-01-24 LAB — CBC
HCT: 31.3 % — ABNORMAL LOW (ref 36.0–46.0)
Hemoglobin: 10 g/dL — ABNORMAL LOW (ref 12.0–15.0)
MCH: 26.7 pg (ref 26.0–34.0)
MCHC: 31.9 g/dL (ref 30.0–36.0)
MCV: 83.5 fL (ref 80.0–100.0)
Platelets: 268 10*3/uL (ref 150–400)
RBC: 3.75 MIL/uL — ABNORMAL LOW (ref 3.87–5.11)
RDW: 14.6 % (ref 11.5–15.5)
WBC: 9.1 10*3/uL (ref 4.0–10.5)
nRBC: 0 % (ref 0.0–0.2)

## 2023-01-24 LAB — POCT FERN TEST: POCT Fern Test: POSITIVE

## 2023-01-24 MED ORDER — ONDANSETRON HCL 4 MG/2ML IJ SOLN
4.0000 mg | Freq: Four times a day (QID) | INTRAMUSCULAR | Status: DC | PRN
  Administered 2023-01-25: 4 mg via INTRAVENOUS
  Filled 2023-01-24: qty 2

## 2023-01-24 MED ORDER — ACETAMINOPHEN 325 MG PO TABS: 650.0000 mg | ORAL_TABLET | ORAL | Status: DC | PRN

## 2023-01-24 MED ORDER — PENICILLIN G POT IN DEXTROSE 60000 UNIT/ML IV SOLN
3.0000 10*6.[IU] | INTRAVENOUS | Status: DC
  Administered 2023-01-24 – 2023-01-25 (×2): 3 10*6.[IU] via INTRAVENOUS
  Filled 2023-01-24 (×2): qty 50

## 2023-01-24 MED ORDER — SODIUM CHLORIDE 0.9 % IV SOLN
5.0000 10*6.[IU] | Freq: Once | INTRAVENOUS | Status: AC
  Administered 2023-01-24: 5 10*6.[IU] via INTRAVENOUS
  Filled 2023-01-24: qty 5

## 2023-01-24 MED ORDER — OXYTOCIN-SODIUM CHLORIDE 30-0.9 UT/500ML-% IV SOLN
2.5000 [IU]/h | INTRAVENOUS | Status: DC
  Filled 2023-01-24: qty 500

## 2023-01-24 MED ORDER — OXYTOCIN BOLUS FROM INFUSION
333.0000 mL | Freq: Once | INTRAVENOUS | Status: AC
  Administered 2023-01-25: 333 mL via INTRAVENOUS

## 2023-01-24 MED ORDER — LIDOCAINE HCL (PF) 1 % IJ SOLN: 30.0000 mL | INTRAMUSCULAR | Status: DC | PRN

## 2023-01-24 MED ORDER — SOD CITRATE-CITRIC ACID 500-334 MG/5ML PO SOLN: 30.0000 mL | ORAL | Status: DC | PRN

## 2023-01-24 MED ORDER — OXYCODONE-ACETAMINOPHEN 5-325 MG PO TABS: 2.0000 | ORAL_TABLET | ORAL | Status: DC | PRN

## 2023-01-24 MED ORDER — LACTATED RINGERS IV SOLN: INTRAVENOUS | Status: DC

## 2023-01-24 MED ORDER — LACTATED RINGERS IV SOLN: 500.0000 mL | INTRAVENOUS | Status: DC | PRN

## 2023-01-24 MED ORDER — OXYCODONE-ACETAMINOPHEN 5-325 MG PO TABS: 1.0000 | ORAL_TABLET | ORAL | Status: DC | PRN

## 2023-01-24 NOTE — Progress Notes (Signed)
   PRENATAL VISIT NOTE  Subjective:  Sue Lee is a 27 y.o. [phone] at [redacted]w[redacted]d being seen today for ongoing prenatal care.  She is currently monitored for the following issues for this low-risk pregnancy and has Supervision of other normal pregnancy, antepartum; Late prenatal care affecting pregnancy in second trimester; and Rubella non-immune status, antepartum on their problem list.  Patient reports no complaints.  Contractions: Irritability. Vag. Bleeding: None.  Movement: Present. Denies leaking of fluid.   The following portions of the patient's history were reviewed and updated as appropriate: allergies, current medications, past family history, past medical history, past social history, past surgical history and problem list.   Objective:   Vitals:   01/24/23 1120  BP: 105/65  Pulse: 94  Weight: 143 lb 6.4 oz (65 kg)    Fetal Status: Fetal Heart Rate (bpm): 145 Fundal Height: 36 cm Movement: Present  Presentation: Vertex  General:  Alert, oriented and cooperative. Patient is in no acute distress.  Skin: Skin is warm and dry. No rash noted.   Cardiovascular: Normal heart rate noted  Respiratory: Normal respiratory effort, no problems with respiration noted  Abdomen: Soft, gravid, appropriate for gestational age.  Pain/Pressure: Absent     Pelvic: Cervical exam performed in the presence of a chaperone Dilation: 2.5 Effacement (%): 50 Station: -2  Extremities: Normal range of motion.  Edema: None  Mental Status: Normal mood and affect. Normal behavior. Normal judgment and thought content.   Assessment and Plan:  Pregnancy: [id number] at [redacted]w[redacted]d 1. Supervision of other normal pregnancy, antepartum Cultures today - Cervicovaginal ancillary only( [place]) - Culture, beta strep (group b only)  2. Rubella non-immune status, antepartum MMR pp  3. Late prenatal care affecting pregnancy in second trimester   Preterm labor symptoms and general obstetric precautions including but  not limited to vaginal bleeding, contractions, leaking of fluid and fetal movement were reviewed in detail with the patient. Please refer to After Visit Summary for other counseling recommendations.   Return in 1 week (on 01/31/2023).  Future Appointments  Date Time Provider Department Center  01/31/2023 11:15 AM Adam Phenix, MD [facility]    Reva Bores, MD

## 2023-01-24 NOTE — H&P (Signed)
OBSTETRIC ADMISSION HISTORY AND PHYSICAL  Sue Lee is a 27 y.o. female [phone] with IUP at [redacted]w[redacted]d by LMP presenting for PPROM. She reports +FMs, no VB, no blurry vision, headaches or peripheral edema, and RUQ pain.  She plans on breast feeding. She request pills for birth control. She received her prenatal care at [facility]   Dating: By LMP --->  Estimated Date of Delivery: 02/20/23  Sono:    @[redacted]w[redacted]d , CWD, normal anatomy, cephalic presentation, 450g, 47% EFW   Prenatal History/Complications: Late prenatal care in 2nd trimester Rubella non-immune  Past Medical History: Past Medical History:  Diagnosis Date   Medical history non-contributory     Past Surgical History: Past Surgical History:  Procedure Laterality Date   WISDOM TOOTH EXTRACTION      Obstetrical History: OB History     Gravida  4   Para  2   Term  2   Preterm  0   AB  1   Living  2      SAB  1   IAB  0   Ectopic  0   Multiple      Live Births  2           Social History Social History   Socioeconomic History   Marital status: Single    Spouse name: Not on file   Number of children: Not on file   Years of education: Not on file   Highest education level: Not on file  Occupational History   Not on file  Tobacco Use   Smoking status: Former    Types: Cigarettes    Quit date: 08/07/2022    Years since quitting: 0.4    Passive exposure: Never   Smokeless tobacco: Never  Vaping Use   Vaping Use: Never used  Substance and Sexual Activity   Alcohol use: Never   Drug use: Never   Sexual activity: Yes    Birth control/protection: None  Other Topics Concern   Not on file  Social History Narrative   ** Merged History Encounter **       Social Determinants of Health   Financial Resource Strain: Not on file  Food Insecurity: No Food Insecurity (11/23/2022)   Hunger Vital Sign    Worried About Running Out of Food in the Last Year: Never true    Ran Out of Food in the Last Year:  Never true  Recent Concern: Food Insecurity - Food Insecurity Present (09/09/2022)   Hunger Vital Sign    Worried About Running Out of Food in the Last Year: Sometimes true    Ran Out of Food in the Last Year: Sometimes true  Transportation Needs: No Transportation Needs (11/23/2022)   PRAPARE - Administrator, Civil Service (Medical): No    Lack of Transportation (Non-Medical): No  Physical Activity: Not on file  Stress: Not on file  Social Connections: Not on file    Family History: Family History  Problem Relation Age of Onset   Hypertension Mother    Diabetes Mother    Healthy Mother    Healthy Father    Diabetes Maternal Aunt    Diabetes Maternal Uncle    Hypertension Maternal Grandfather    Asthma Neg Hx    Cancer Neg Hx    Heart disease Neg Hx     Allergies: No Known Allergies  Medications Prior to Admission  Medication Sig Dispense Refill Last Dose   ferrous sulfate 324 MG TBEC Take 1  tablet (324 mg total) by mouth every other day for 42 doses. 42 tablet 0 01/24/2023   Prenatal Vit-Fe Fumarate-FA (MULTIVITAMIN-PRENATAL) 27-0.8 MG TABS tablet Take 1 tablet by mouth daily at 12 noon.   01/24/2023   Blood Pressure Monitoring DEVI 1 each by Does not apply route once a week. 1 each 0      Review of Systems   All systems reviewed and negative except as stated in HPI  Blood pressure 113/60, pulse (!) 107, temperature 99.7 F (37.6 C), temperature source Oral, resp. rate 19, height 5\' 2"  (1.575 m), weight 63.5 kg, last menstrual period 05/06/2022, SpO2 100 %. General appearance: alert, cooperative, and no distress Lungs: Normal work of breathing on room air Pelvic: See below Extremities: no sign of DVT Presentation: cephalic Fetal monitoringBaseline: 140 bpm, Variability: Good {> 6 bpm), Accelerations: Reactive, and Decelerations: Absent Uterine activityFrequency: Every 2-5 minutes     Prenatal labs: ABO, Rh: A/Positive/-- (02/12 1031) Antibody:  Negative (02/12 1031) Rubella: <0.90 (02/12 1031) RPR: Non Reactive (05/09 0930)  HBsAg: Negative (02/12 1031)  HIV: Non Reactive (05/09 0930)  GBS:    2 hr Glucola normal Genetic screening  LR NIPS Anatomy US normal  Prenatal Transfer Tool  Maternal Diabetes: No Genetic Screening: Normal Maternal Ultrasounds/Referrals: Normal Fetal Ultrasounds or other Referrals:  None Maternal Substance Abuse:  No Significant Maternal Medications:  None Significant Maternal Lab Results:  GBS unknown Number of Prenatal Visits:greater than 3 verified prenatal visits Other Comments:  None  Results for orders placed or performed during the hospital encounter of 01/24/23 (from the past 24 hour(s))  Fern Test   Collection Time: 01/24/23  7:09 PM  Result Value Ref Range   POCT Fern Test Positive = ruptured amniotic membanes     Patient Active Problem List   Diagnosis Date Noted   Rubella non-immune status, antepartum 09/13/2022   Late prenatal care affecting pregnancy in second trimester 09/09/2022   Supervision of other normal pregnancy, antepartum 08/01/2022    Assessment/Plan:  Sue Lee is a 27 y.o. [id number] at [redacted]w[redacted]d here for PPROM.   #Labor:Expectant management. Augment with cytotec, pitocin, and foley as necessary. #Pain: Maternally supported, may have epidural when appropriate #FWB: Cat I #ID:  GBS unknown, pre-term labor > start PCN #MOF: Breast #MOC: Requests PO options #Circ:  N/A  Tiffany Kocher, DO  01/24/2023, 7:24 PM

## 2023-01-24 NOTE — Progress Notes (Signed)
Pt informed that the ultrasound is considered a limited OB ultrasound and is not intended to be a complete ultrasound exam.  Patient also informed that the ultrasound is not being completed with the intent of assessing for fetal or placental anomalies or any pelvic abnormalities.  Explained that the purpose of today's ultrasound is to assess for  presentation.  Patient acknowledges the purpose of the exam and the limitations of the study.    Cephalic presentation confirmed.  

## 2023-01-24 NOTE — MAU Note (Addendum)
.  Sue Lee is a 27 y.o. at [redacted]w[redacted]d here in MAU reporting: ROM at 1445 - continues to leak. States fluid is clear. Was seen in the office today and had SVE - was 2.5cm. denies ctx or VB. +FM   Onset of complaint: 1445 Pain score: 0 Vitals:   01/24/23 1902  BP: 113/60  Pulse: (!) 107  Resp: 19  Temp: 99.7 F (37.6 C)  SpO2: 100%     FHT:123 Lab orders placed from triage:  fern

## 2023-01-25 ENCOUNTER — Encounter (HOSPITAL_COMMUNITY): Payer: Self-pay | Admitting: Student

## 2023-01-25 ENCOUNTER — Inpatient Hospital Stay (HOSPITAL_COMMUNITY): Payer: Medicaid Other | Admitting: Anesthesiology

## 2023-01-25 DIAGNOSIS — Z3A36 36 weeks gestation of pregnancy: Secondary | ICD-10-CM

## 2023-01-25 DIAGNOSIS — O42013 Preterm premature rupture of membranes, onset of labor within 24 hours of rupture, third trimester: Secondary | ICD-10-CM

## 2023-01-25 LAB — RPR: RPR Ser Ql: NONREACTIVE

## 2023-01-25 MED ORDER — SENNOSIDES-DOCUSATE SODIUM 8.6-50 MG PO TABS
2.0000 | ORAL_TABLET | Freq: Every day | ORAL | Status: DC
  Administered 2023-01-26 – 2023-01-27 (×2): 2 via ORAL
  Filled 2023-01-25 (×2): qty 2

## 2023-01-25 MED ORDER — BENZOCAINE-MENTHOL 20-0.5 % EX AERO: 1.0000 | INHALATION_SPRAY | CUTANEOUS | Status: DC | PRN

## 2023-01-25 MED ORDER — FENTANYL-BUPIVACAINE-NACL 0.5-0.125-0.9 MG/250ML-% EP SOLN
12.0000 mL/h | EPIDURAL | Status: DC | PRN
  Administered 2023-01-25: 12 mL/h via EPIDURAL
  Filled 2023-01-25: qty 250

## 2023-01-25 MED ORDER — ZOLPIDEM TARTRATE 5 MG PO TABS: 5.0000 mg | ORAL_TABLET | Freq: Every evening | ORAL | Status: DC | PRN

## 2023-01-25 MED ORDER — DIPHENHYDRAMINE HCL 50 MG/ML IJ SOLN: 12.5000 mg | INTRAMUSCULAR | Status: DC | PRN

## 2023-01-25 MED ORDER — PHENYLEPHRINE 80 MCG/ML (10ML) SYRINGE FOR IV PUSH (FOR BLOOD PRESSURE SUPPORT): 80.0000 ug | PREFILLED_SYRINGE | INTRAVENOUS | Status: DC | PRN

## 2023-01-25 MED ORDER — LACTATED RINGERS IV SOLN: 500.0000 mL | Freq: Once | INTRAVENOUS | Status: DC

## 2023-01-25 MED ORDER — ACETAMINOPHEN 325 MG PO TABS
650.0000 mg | ORAL_TABLET | ORAL | Status: DC | PRN
  Administered 2023-01-25 – 2023-01-26 (×4): 650 mg via ORAL
  Filled 2023-01-25 (×4): qty 2

## 2023-01-25 MED ORDER — PRENATAL MULTIVITAMIN CH
1.0000 | ORAL_TABLET | Freq: Every day | ORAL | Status: DC
  Administered 2023-01-25 – 2023-01-27 (×3): 1 via ORAL
  Filled 2023-01-25 (×3): qty 1

## 2023-01-25 MED ORDER — IBUPROFEN 600 MG PO TABS
600.0000 mg | ORAL_TABLET | Freq: Four times a day (QID) | ORAL | Status: DC
  Administered 2023-01-25 – 2023-01-27 (×9): 600 mg via ORAL
  Filled 2023-01-25 (×9): qty 1

## 2023-01-25 MED ORDER — DIBUCAINE (PERIANAL) 1 % EX OINT: 1.0000 | TOPICAL_OINTMENT | CUTANEOUS | Status: DC | PRN

## 2023-01-25 MED ORDER — TRANEXAMIC ACID-NACL 1000-0.7 MG/100ML-% IV SOLN
1000.0000 mg | INTRAVENOUS | Status: AC
  Administered 2023-01-25: 1000 mg via INTRAVENOUS

## 2023-01-25 MED ORDER — TRANEXAMIC ACID-NACL 1000-0.7 MG/100ML-% IV SOLN
INTRAVENOUS | Status: AC
  Filled 2023-01-25: qty 100

## 2023-01-25 MED ORDER — OXYTOCIN-SODIUM CHLORIDE 30-0.9 UT/500ML-% IV SOLN
1.0000 m[IU]/min | INTRAVENOUS | Status: DC
  Administered 2023-01-25: 2 m[IU]/min via INTRAVENOUS

## 2023-01-25 MED ORDER — ONDANSETRON HCL 4 MG/2ML IJ SOLN: 4.0000 mg | INTRAMUSCULAR | Status: DC | PRN

## 2023-01-25 MED ORDER — EPHEDRINE 5 MG/ML INJ: 10.0000 mg | INTRAVENOUS | Status: DC | PRN

## 2023-01-25 MED ORDER — SIMETHICONE 80 MG PO CHEW: 80.0000 mg | CHEWABLE_TABLET | ORAL | Status: DC | PRN

## 2023-01-25 MED ORDER — TERBUTALINE SULFATE 1 MG/ML IJ SOLN: 0.2500 mg | Freq: Once | INTRAMUSCULAR | Status: DC | PRN

## 2023-01-25 MED ORDER — WITCH HAZEL-GLYCERIN EX PADS: 1.0000 | MEDICATED_PAD | CUTANEOUS | Status: DC | PRN

## 2023-01-25 MED ORDER — COCONUT OIL OIL: 1.0000 | TOPICAL_OIL | Status: DC | PRN

## 2023-01-25 MED ORDER — TETANUS-DIPHTH-ACELL PERTUSSIS 5-2.5-18.5 LF-MCG/0.5 IM SUSY: 0.5000 mL | PREFILLED_SYRINGE | Freq: Once | INTRAMUSCULAR | Status: DC

## 2023-01-25 MED ORDER — LIDOCAINE HCL (PF) 1 % IJ SOLN
INTRAMUSCULAR | Status: DC | PRN
  Administered 2023-01-25 (×2): 4 mL via EPIDURAL

## 2023-01-25 MED ORDER — ONDANSETRON HCL 4 MG PO TABS: 4.0000 mg | ORAL_TABLET | ORAL | Status: DC | PRN

## 2023-01-25 MED ORDER — DIPHENHYDRAMINE HCL 25 MG PO CAPS: 25.0000 mg | ORAL_CAPSULE | Freq: Four times a day (QID) | ORAL | Status: DC | PRN

## 2023-01-25 NOTE — Anesthesia Procedure Notes (Signed)
Epidural Patient location during procedure: OB Start time: 01/25/2023 4:55 AM End time: 01/25/2023 4:58 AM  Staffing Anesthesiologist: Kaylyn Layer, MD Performed: anesthesiologist   Preanesthetic Checklist Completed: patient identified, IV checked, risks and benefits discussed, monitors and equipment checked, pre-op evaluation and timeout performed  Epidural Patient position: sitting Prep: DuraPrep and site prepped and draped Patient monitoring: continuous pulse ox, blood pressure and heart rate Approach: midline Location: L3-L4 Injection technique: LOR air  Needle:  Needle type: Tuohy  Needle gauge: 17 G Needle length: 9 cm Needle insertion depth: 4 cm Catheter type: closed end flexible Catheter size: 19 Gauge Catheter at skin depth: 9 cm Test dose: negative and Other (1% lidocaine)  Assessment Events: blood not aspirated, no cerebrospinal fluid, injection not painful, no injection resistance, no paresthesia and negative IV test  Additional Notes Patient identified. Risks, benefits, and alternatives discussed with patient including but not limited to bleeding, infection, nerve damage, paralysis, failed block, incomplete pain control, headache, blood pressure changes, nausea, vomiting, reactions to medication, itching, and postpartum back pain. Confirmed with bedside nurse the patient's most recent platelet count. Confirmed with patient that they are not currently taking any anticoagulation, have any bleeding history, or any family history of bleeding disorders. Patient expressed understanding and wished to proceed. All questions were answered. Sterile technique was used throughout the entire procedure. Please see nursing notes for vital signs.   Crisp LOR on first pass. Test dose was given through epidural catheter and negative prior to continuing to dose epidural or start infusion. Warning signs of high block given to the patient including shortness of breath,  tingling/numbness in hands, complete motor block, or any concerning symptoms with instructions to call for help. Patient was given instructions on fall risk and not to get out of bed. All questions and concerns addressed with instructions to call with any issues or inadequate analgesia.  Reason for block:procedure for pain

## 2023-01-25 NOTE — Progress Notes (Signed)
Labor Progress Note Sue Lee is a 27 y.o. [phone] at [redacted]w[redacted]d presented for PPROM  S: No acute concerns.   O:  BP 116/70   Pulse 93   Temp 98.1 F (36.7 C) (Oral)   Resp 19   Ht 5\' 2"  (1.575 m)   Wt 63.5 kg   LMP 05/06/2022   SpO2 100%   BMI 25.61 kg/m  EFM: 135bpm/moderate/+accels, no decels  CVE: Dilation: 4 Effacement (%): 70 Station: -2 Presentation: Vertex Exam by:: autry lott   A&P: 27 y.o. [id number] [redacted]w[redacted]d here for PPROM #Labor: Progressing well. Contracting every 10 mins. Will start pitocin 2 by 2 #Pain: Maternally supported #FWB: Cat I  #GBS positive, PCN ppx  Rubella NI -MMR pp  Randa Evens Autry-Lott, DO 12:47 AM

## 2023-01-25 NOTE — Anesthesia Preprocedure Evaluation (Signed)
Anesthesia Evaluation  Patient identified by MRN, date of birth, ID band Patient awake    Reviewed: Allergy & Precautions, Patient's Chart, lab work & pertinent test results  History of Anesthesia Complications Negative for: history of anesthetic complications  Airway Mallampati: II  TM Distance: >3 FB Neck ROM: Full    Dental no notable dental hx.    Pulmonary former smoker   Pulmonary exam normal        Cardiovascular negative cardio ROS Normal cardiovascular exam     Neuro/Psych negative neurological ROS  negative psych ROS   GI/Hepatic negative GI ROS, Neg liver ROS,,,  Endo/Other  negative endocrine ROS    Renal/GU negative Renal ROS  negative genitourinary   Musculoskeletal negative musculoskeletal ROS (+)    Abdominal   Peds  Hematology negative hematology ROS (+)   Anesthesia Other Findings Day of surgery medications reviewed with patient.  Reproductive/Obstetrics (+) Pregnancy                             Anesthesia Physical Anesthesia Plan  ASA: 2  Anesthesia Plan: Epidural   Post-op Pain Management:    Induction:   PONV Risk Score and Plan: Treatment may vary due to age or medical condition  Airway Management Planned: Natural Airway  Additional Equipment: Fetal Monitoring  Intra-op Plan:   Post-operative Plan:   Informed Consent: I have reviewed the patients History and Physical, chart, labs and discussed the procedure including the risks, benefits and alternatives for the proposed anesthesia with the patient or authorized representative who has indicated his/her understanding and acceptance.       Plan Discussed with:   Anesthesia Plan Comments:        Anesthesia Quick Evaluation  

## 2023-01-25 NOTE — Lactation Note (Signed)
This note was copied from a baby's chart. Lactation Consultation Note  Patient Name: Sue Lee Date: 01/25/2023 Age:28 hours Reason for consult: Initial assessment;Late-preterm 34-36.6wks;Breastfeeding assistance The infant was at 60 hours old.  LC entered the room and the blanket was covering the infant's face. LC swaddled the infant again and adjusted the blanket to keep it away from the infant's face.  LC asked the birth parent if she had been spoken to about the LPTI policy.  She stated that she had but she has not started pumping or any supplementing.  LC spoke with the birth parent about setting up the DEBP and she said that she preferred to use her own.  LC encouraged her to pump after feedings.  LC provided bottles.  LC also spoke with the birth parent about supplementing the infant and spoke with her about supplementation options.  LC notified the RN about the birth parent's choice to use her pump and supplementing.  She stated that she would speak with the birth parent more about supplementation.  The birth parent had no questions or concerns.   Infant Feeding Plan:  Breastfeed 8+ times in 24 hours according to feeding cues.  Put the infant to the breast prior to supplementing.  Pump after feedings and feed the expressed milk to the infant via a bottle.  Follow the LPTI policy supplementation guidelines.  Call RN/LC for assistance with breastfeeding.    Maternal Data Has patient been taught Hand Expression?: Yes Does the patient have breastfeeding experience prior to this delivery?: Yes How long did the patient breastfeed?: 2 months  Feeding Mother's Current Feeding Choice: Breast Milk  LATCH Score                    Lactation Tools Discussed/Used    Interventions Interventions: Breast feeding basics reviewed;Education;LC Services brochure;LPT handout/interventions  Discharge Pump: Hands Free;Personal  Consult Status Consult Status:  Follow-up Date: 01/26/23 Follow-up type: In-patient    [name] 01/25/2023, 1:54 PM

## 2023-01-25 NOTE — Progress Notes (Signed)
Labor Progress Note Sue Lee is a 27 y.o. [phone] at [redacted]w[redacted]d presented for PPROM  S: Some hip discomfort pending after epidural and starting to feel some pelvic discomfort.   O:  BP 119/61   Lee 99   Temp (!) 97.4 F (36.3 C) (Oral)   Resp 16   Ht 5\' 2"  (1.575 m)   Wt 63.5 kg   LMP 05/06/2022   SpO2 100%   BMI 25.61 kg/m  EFM: 120bpm/moderate/+accels, variable decels  CVE: Dilation: 7.5 Effacement (%): 100 Station: 0 Presentation: Vertex Exam by:: Sue Lee   A&P: 27 y.o. [id number] [redacted]w[redacted]d here for PPROM #Labor: Progressing well. Continue pitocin.  #Pain: Maternally supported #FWB: Cat II, overall reassuring with good variability. Optimize maternal positioning once comfortable with epidural  #GBS positive, PCN ppx  Rubella NI -MMR pp  Sue Kader Autry-Lott, DO 5:30 AM

## 2023-01-25 NOTE — Discharge Summary (Signed)
Postpartum Discharge Summary  Date of Service updated***     Patient Name: Sue Lee DOB: 08/31/1995 MRN: [id number]  Date of admission: 01/24/2023 Delivery date:01/25/2023  Delivering provider: Lavonda Jumbo  Date of discharge: 01/26/2023  Admitting diagnosis: Indication for care in labor or delivery [O75.9] Intrauterine pregnancy: [redacted]w[redacted]d     Secondary diagnosis:  Principal Problem:   Indication for care in labor or delivery Active Problems:   Vaginal delivery  Additional problems: Acute blood loss anemia    Discharge diagnosis: Preterm Pregnancy Delivered                                              Post partum procedures: n/a Augmentation: Pitocin Complications: None  Hospital course: Induction of Labor With Vaginal Delivery   27 y.o. yo [id number] at [redacted]w[redacted]d was admitted to the hospital 01/24/2023 for induction of labor.  Indication for induction: PROM.  Patient had an labor course that was uncomplicated Membrane Rupture Time/Date: 2:45 PM ,01/24/2023   Delivery Method:Vaginal, Spontaneous  Episiotomy: None  Lacerations:  None  Details of delivery can be found in separate delivery note.  Patient had a postpartum course complicated by nothing. Patient is discharged home 01/26/23.  Newborn Data: Birth date:01/25/2023  Birth time:5:52 AM  Gender:Female  Living status:Living  Apgars:9 ,9  Weight:2640 g   Magnesium Sulfate received: No BMZ received: No Rhophylac:No MMR: Offered postnatally T-DaP: Offered postnatally  Flu: No Transfusion:No  Physical exam  Vitals:   01/25/23 0920 01/25/23 1730 01/25/23 2130 01/26/23 0531  BP: 110/64 (!) 103/59 105/62 (!) 98/51  Pulse: 81 88 82 76  Resp: 18 18 18 16   Temp: 98.2 F (36.8 C) 98.4 F (36.9 C) 97.9 F (36.6 C) 97.9 F (36.6 C)  TempSrc: Oral Oral Oral Axillary  SpO2: 100% 100% 100% 100%  Weight:      Height:       General: alert, cooperative, and no distress Lochia: appropriate Uterine Fundus:  firm Incision: N/A DVT Evaluation: No evidence of DVT seen on physical exam. Negative Homan's sign. No cords or calf tenderness. No significant calf/ankle edema. Labs: Lab Results  Component Value Date   WBC 9.1 01/24/2023   HGB 10.0 (L) 01/24/2023   HCT 31.3 (L) 01/24/2023   MCV 83.5 01/24/2023   PLT 268 01/24/2023      Latest Ref Rng & Units 12/05/2022    9:30 AM  CMP  Glucose 70 - 99 mg/dL 71   BUN 6 - 20 mg/dL 8   Creatinine 1.47 - 8.29 mg/dL 5.62   Sodium 130 - 865 mmol/L 135   Potassium 3.5 - 5.2 mmol/L 4.0   Chloride 96 - 106 mmol/L 102   CO2 20 - 29 mmol/L 19   Calcium 8.7 - 10.2 mg/dL 8.6   Total Protein 6.0 - 8.5 g/dL 6.6   Total Bilirubin 0.0 - 1.2 mg/dL 0.3   Alkaline Phos 44 - 121 IU/L 79   AST 0 - 40 IU/L 16   ALT 0 - 32 IU/L 10    Edinburgh Score:    01/26/2023    5:31 AM  Edinburgh Postnatal Depression Scale Screening Tool  I have been able to laugh and see the funny side of things. 0  I have looked forward with enjoyment to things. 0  I have blamed myself unnecessarily when things went wrong. 1  I have been anxious or worried for no good reason. 2  I have felt scared or panicky for no good reason. 0  Things have been getting on top of me. 2  I have been so unhappy that I have had difficulty sleeping. 1  I have felt sad or miserable. 1  I have been so unhappy that I have been crying. 0  The thought of harming myself has occurred to me. 0  Edinburgh Postnatal Depression Scale Total 7     After visit meds:  Allergies as of 01/26/2023   No Known Allergies   Med Rec must be completed prior to using this [facility]***        Discharge home in stable condition Infant Feeding: Breast Infant Disposition:home with mother Discharge instruction: per After Visit Summary and Postpartum booklet. Activity: Advance as tolerated. Pelvic rest for 6 weeks.  Diet: routine diet Future Appointments: Future Appointments  Date Time Provider Department Center   01/31/2023 11:15 AM Adam Phenix, MD [facility]   Follow up Visit:  Message sent to [facility] by Autry-Lott on 01/26/2023  Please schedule this patient for a In person postpartum visit in 6 weeks with the following provider: Any provider. Additional Postpartum F/U: none   Low risk pregnancy complicated by:  rubella NI and late to care Delivery mode:  Vaginal, Spontaneous  Anticipated Birth Control:  POPs   01/26/2023 Randa Evens Autry-Lott, DO

## 2023-01-26 MED ORDER — MEASLES, MUMPS & RUBELLA VAC IJ SOLR
0.5000 mL | Freq: Once | INTRAMUSCULAR | Status: AC
  Administered 2023-01-26: 0.5 mL via SUBCUTANEOUS
  Filled 2023-01-26: qty 0.5

## 2023-01-26 MED ORDER — OXYCODONE HCL 5 MG PO TABS
5.0000 mg | ORAL_TABLET | Freq: Once | ORAL | Status: DC | PRN
  Filled 2023-01-26: qty 1

## 2023-01-26 NOTE — Progress Notes (Signed)
POSTPARTUM PROGRESS NOTE  Post Partum Day 1  Subjective:  Ruth Slusher is a 27 y.o. [phone] s/p VD at [redacted]w[redacted]d.  She reports she is doing well. No acute events overnight. She denies any problems with ambulating, voiding or po intake. Denies nausea or vomiting.  Pain is well controlled, has cramping with breastfeeding.  Lochia is appropriate.  Objective: Blood pressure (!) 98/51, pulse 76, temperature 97.9 F (36.6 C), temperature source Axillary, resp. rate 16, height 5\' 2"  (1.575 m), weight 63.5 kg, last menstrual period 05/06/2022, SpO2 100 %, unknown if currently breastfeeding.  Physical Exam:  General: alert, cooperative and no distress Chest: no respiratory distress Heart:regular rate, distal pulses intact Abdomen: soft, nontender,  Uterine Fundus: firm, appropriately tender DVT Evaluation: No calf swelling or tenderness Extremities: No LE edema Skin: warm, dry  Recent Labs    01/24/23 1920  HGB 10.0*  HCT 31.3*    Assessment/Plan: Elizeabeth Sinibaldi is a 27 y.o. [id number] s/p VD at [redacted]w[redacted]d   PPD#1 - Doing well  Routine postpartum care Contraception: POPs Feeding: Breast Dispo: Plan for discharge 6/30-7/1.   LOS: 2 days   Lavonda Jumbo, DO OB Fellow, Faculty [facility], Center for [facility] 01/26/2023, 7:21 AM

## 2023-01-26 NOTE — Anesthesia Postprocedure Evaluation (Signed)
Anesthesia Post Note  Patient: Sue Lee  Procedure(s) Performed: AN AD HOC LABOR EPIDURAL     Patient location during evaluation: Mother Baby Anesthesia Type: Epidural Level of consciousness: awake and alert Pain management: pain level controlled Vital Signs Assessment: post-procedure vital signs reviewed and stable Respiratory status: spontaneous breathing, nonlabored ventilation and respiratory function stable Cardiovascular status: stable Postop Assessment: no headache, no backache, epidural receding, no apparent nausea or vomiting, patient able to bend at knees, able to ambulate and adequate PO intake Anesthetic complications: no   No notable events documented.  Last Vitals:  Vitals:   01/25/23 2130 01/26/23 0531  BP: 105/62 (!) 98/51  Pulse: 82 76  Resp: 18 16  Temp: 36.6 C 36.6 C  SpO2: 100% 100%    Last Pain:  Vitals:   01/26/23 1100  TempSrc:   PainSc: 4    Pain Goal: Patients Stated Pain Goal: 9 (01/25/23 0401)                 Laban Emperor

## 2023-01-26 NOTE — Lactation Note (Signed)
This note was copied from a baby's chart. Lactation Consultation Note  Patient Name: Sue Lee Date: 01/26/2023 Age:28 hours Reason for consult: Follow-up assessment;Infant < 6lbs;Infant weight loss;Late-preterm 34-36.6wks;Breastfeeding assistance (6.44% WL) The infant is at 52 hours old.  LC spoke with the RN and she stated that the birth parent had not been supplementing over night.  The birth parent spoke with the birth parent and she said she attempted to pump, but was having some pain.  LC asked the birth parent if she would like to try the DEBP.  The birth parent was receptive.  LC also spoke with the birth parent about supplementing the infant due to the LPTI policy and the infant's weight loss.  The birth parent stated that she would be open to using DBM.  LC educated the birth parent on assembling and disassembling, washing, rinsing, and drying pump parts, and milk storage guidelines. LC answered all questions about DBM and reviewed supplementation volumes and guidelines.  LC informed the birth parent that feedings should be no more than 30 min total (including bottle feeding).  The birth parent had no further questions or concerns.   Infant Feeding Plan:  Breastfeed 8+ times per day according to feeding cues.  Put the infant to the breast prior to supplementing.  Supplement with DBM according to LPTI guidelines.  Pump after every other feeding to protect the birth parent's milk supply.  Call RN/LC for assistance with breastfeeding.  Keep total feedings to 30 min total.   Feeding Mother's Current Feeding Choice: Breast Milk and Donor Milk  Lactation Tools Discussed/Used Tools: Pump;Flanges Flange Size: 21 Breast pump type: Double-Electric Breast Pump Pump Education: Setup, frequency, and cleaning;Milk Storage Reason for Pumping: LPTI policy; infant <6lbs, 6% WL Pumping frequency: After every other feeding  Interventions Interventions:  DEBP;Education  Consult Status Consult Status: Follow-up Date: 01/27/23 Follow-up type: In-patient   Sue Lee 01/26/2023, 9:20 AM

## 2023-01-27 ENCOUNTER — Encounter: Payer: Self-pay | Admitting: Family Medicine

## 2023-01-27 ENCOUNTER — Other Ambulatory Visit (HOSPITAL_COMMUNITY): Payer: Self-pay

## 2023-01-27 DIAGNOSIS — O99820 Streptococcus B carrier state complicating pregnancy: Secondary | ICD-10-CM | POA: Insufficient documentation

## 2023-01-27 LAB — CERVICOVAGINAL ANCILLARY ONLY
Bacterial Vaginitis (gardnerella): POSITIVE — AB
Candida Glabrata: NEGATIVE
Candida Vaginitis: POSITIVE — AB
Chlamydia: NEGATIVE
Comment: NEGATIVE
Comment: NEGATIVE
Comment: NEGATIVE
Comment: NEGATIVE
Comment: NEGATIVE
Comment: NORMAL
Neisseria Gonorrhea: NEGATIVE
Trichomonas: POSITIVE — AB

## 2023-01-27 LAB — CULTURE, BETA STREP (GROUP B ONLY): Strep Gp B Culture: POSITIVE — AB

## 2023-01-27 MED ORDER — SLYND 4 MG PO TABS
1.0000 | ORAL_TABLET | Freq: Every day | ORAL | 0 refills | Status: DC
  Filled 2023-01-27: qty 28, 28d supply, fill #0

## 2023-01-27 MED ORDER — BENZOCAINE-MENTHOL 20-0.5 % EX AERO
1.0000 | INHALATION_SPRAY | CUTANEOUS | 0 refills | Status: DC | PRN
  Filled 2023-01-27: qty 78, fill #0

## 2023-01-27 MED ORDER — ACETAMINOPHEN 325 MG PO TABS
650.0000 mg | ORAL_TABLET | ORAL | 0 refills | Status: DC | PRN
  Filled 2023-01-27: qty 100, 9d supply, fill #0

## 2023-01-27 MED ORDER — SENNOSIDES-DOCUSATE SODIUM 8.6-50 MG PO TABS
2.0000 | ORAL_TABLET | Freq: Every day | ORAL | 0 refills | Status: DC
  Filled 2023-01-27: qty 30, 15d supply, fill #0

## 2023-01-27 MED ORDER — IBUPROFEN 600 MG PO TABS
600.0000 mg | ORAL_TABLET | Freq: Four times a day (QID) | ORAL | 0 refills | Status: DC
  Filled 2023-01-27: qty 30, 8d supply, fill #0

## 2023-01-28 ENCOUNTER — Ambulatory Visit (HOSPITAL_COMMUNITY): Payer: Self-pay

## 2023-01-28 NOTE — Lactation Note (Signed)
This note was copied from a baby's chart. Lactation Consultation Note  Patient Name: Sue Lee Today's Date: 01/28/2023 Age:28 hours  Attempted to see mom but she was sleeping.   Maternal Data    Feeding    LATCH Score                    Lactation Tools Discussed/Used    Interventions    Discharge    Consult Status      Charyl Dancer 01/28/2023, 1:56 AM

## 2023-01-28 NOTE — Lactation Note (Signed)
This note was copied from a baby's chart. Lactation Consultation Note  Patient Name: Sue Lee Date: 01/28/2023 Age:28 hours Reason for consult: Follow-up assessment;Infant < 6lbs;Late-preterm 34-36.6wks;Infant weight loss (8 % weight loss, dyad ready for D/C . LC reviewed BF D/C teaching and the [facility] resources.)   Maternal Data    Feeding Mother's Current Feeding Choice: Breast Milk and Donor Milk   Lactation Tools Discussed/Used Tools: Pump Flange Size: 21 Breast pump type: Manual;Double-Electric Breast Pump Pump Education: Milk Storage  Interventions Interventions: Education;DEBP;Breast feeding basics reviewed  Discharge Discharge Education: Engorgement and breast care;Warning signs for feeding baby Pump: Hands Free;Personal  Consult Status Consult Status: Complete Date: 01/28/23    Kathrin Greathouse 01/28/2023, 10:48 AM

## 2023-01-29 ENCOUNTER — Telehealth: Payer: Self-pay

## 2023-01-29 ENCOUNTER — Encounter: Payer: Self-pay | Admitting: *Deleted

## 2023-01-29 DIAGNOSIS — A5901 Trichomonal vulvovaginitis: Secondary | ICD-10-CM

## 2023-01-29 DIAGNOSIS — B379 Candidiasis, unspecified: Secondary | ICD-10-CM

## 2023-01-29 NOTE — Telephone Encounter (Signed)
Attempted to call patient at number listed in chart--sent to unidentified VM. Left message to call office back to go over some test results and next steps for treatment. Please relay information from Dr. Tawni Levy note below once patient calls back.   Maureen Ralphs RN on 01/29/23 at 1112

## 2023-01-29 NOTE — Telephone Encounter (Signed)
-----   Message from Reva Bores, MD sent at 01/29/2023 12:16 AM EDT ----- Has trich - but has delivered. Cannot take flagyl while breast feeding. Can send in 2, and will need to pump and dump milk for 12-24 hours. Needs partner treatment. Thanks

## 2023-01-29 NOTE — Progress Notes (Signed)
Pt seen at 11/23/22 screening event where her b/p was 96/56. At the event, pt noted that her PCP was [facility], with whom she has had multiple appt since the event, including a healthy delivery on 01/25/2023. During initial f/u by health equity team, pt denied any ongoing SDOH needs and health equity team member f/u with FNP Theora Gianotti about possible personal safety concerns per the event documentation that pt had denied by phone. During second event f/u, following pt's 01/25/23 delivery, per chart review, pt has multiple ob/gyn, new infant resources, future ob/gyn appt with pt-designated PCP Myrtie Neither on 03/14/23. No SDOH needs were added during delivery and hospitalization. No additional health equity action taken at this time.

## 2023-01-31 ENCOUNTER — Encounter: Payer: Medicaid Other | Admitting: Obstetrics & Gynecology

## 2023-01-31 MED ORDER — METRONIDAZOLE 500 MG PO TABS
2000.00 mg | ORAL_TABLET | Freq: Once | ORAL | 0 refills | Status: AC
Start: 2023-01-31 — End: 2023-01-31

## 2023-01-31 MED ORDER — FLUCONAZOLE 150 MG PO TABS
150.0000 mg | ORAL_TABLET | Freq: Every day | ORAL | 0 refills | Status: DC
Start: 2023-01-31 — End: 2023-03-23

## 2023-01-31 NOTE — Telephone Encounter (Signed)
Patient returned phone call and left VM on nurse line on 01/30/23. Reviewed recommendation with [name] via secure chat who verifies need for Flagyl 2g one time dose followed by 12-24 hours of pumping and dumping breast milk. Called pt; results reviewed and medication sent.

## 2023-02-24 ENCOUNTER — Telehealth (HOSPITAL_COMMUNITY): Payer: Self-pay | Admitting: *Deleted

## 2023-02-24 NOTE — Telephone Encounter (Signed)
02/24/2023  Name: Sue Lee MRN: [id number] DOB: 09-29-1995  Reason for Call:  Transition of Care Hospital Discharge Call  Contact Status: Patient Contact Status: Complete  Language assistant needed: Interpreter Mode: Interpreter Not Needed        Follow-Up Questions: Do You Have Any Concerns About Your Health As You Heal From Delivery?: No Do You Have Any Concerns About Your Infants Health?: No  Edinburgh Postnatal Depression Scale:  In the Past 7 Days: I have been able to laugh and see the funny side of things.: As much as I always could I have looked forward with enjoyment to things.: As much as I ever did I have blamed myself unnecessarily when things went wrong.: No, never I have been anxious or worried for no good reason.: No, not at all I have felt scared or panicky for no good reason.: No, not at all Things have been getting on top of me.: Yes, sometimes I haven't been coping as well as usual I have been so unhappy that I have had difficulty sleeping.: Not at all I have felt sad or miserable.: No, not at all I have been so unhappy that I have been crying.: No, never The thought of harming myself has occurred to me.: Never Edinburgh Postnatal Depression Scale Total: 2  PHQ2-9 Depression Scale:     Discharge Follow-up:    Post-discharge interventions: Reviewed Newborn Safe Sleep Practices  Salena Saner, RN  02/24/2023 10:10

## 2023-03-14 ENCOUNTER — Encounter: Payer: Self-pay | Admitting: Obstetrics and Gynecology

## 2023-03-14 ENCOUNTER — Other Ambulatory Visit: Payer: Self-pay

## 2023-03-14 ENCOUNTER — Ambulatory Visit (INDEPENDENT_AMBULATORY_CARE_PROVIDER_SITE_OTHER): Payer: Medicaid Other | Admitting: Obstetrics and Gynecology

## 2023-03-14 NOTE — Progress Notes (Signed)
    Post Partum Visit Note  Sue Lee is a 27 y.o. [phone] female who presents for a postpartum visit. She is  6.6  weeks postpartum following a normal spontaneous vaginal delivery.  I have fully reviewed the prenatal and intrapartum course. The delivery was at 36.2 gestational weeks.  Anesthesia: epidural. Postpartum course has been good. Baby is doing well.Sue Lee is feeding by breast. Bleeding no bleeding. Bowel function is normal. Bladder function is normal. Patient is not sexually active. Contraception method is oral progesterone-only contraceptive. Postpartum depression screening: negative.   The pregnancy intention screening data noted above was reviewed. Potential methods of contraception were discussed. The patient elected to proceed with [facility] Maintenance Due  Topic Date Due   COVID-19 Vaccine (1 - 2023-24 season) Never done   INFLUENZA VACCINE  02/27/2023    The following portions of the patient's history were reviewed and updated as appropriate: allergies, current medications, past family history, past medical history, past social history, past surgical history, and problem list.  Review of Systems Pertinent items are noted in HPI.  Objective:  LMP 05/06/2022    General:  alert and cooperative   Breasts:  not indicated  Lungs: Normal effort  Heart:  Normal rate  Abdomen: soft    Wound N/a  GU exam:   N/a       Assessment:   Postpartum exam Doing great!! UTD on pap   Plan:   Essential components of care per ACOG recommendations:  1.  Mood and well being: Patient with negative depression screening today. Reviewed local resources for support.  - Patient tobacco use? No.   - hx of drug use? No.    2. Infant care and feeding:  -Patient currently breastmilk feeding? Yes. Discussed returning to work and pumping. Reviewed importance of draining breast regularly to support lactation.  -Social determinants of health (SDOH) reviewed in EPIC. No  concerns.  3. Sexuality, contraception and birth spacing - Patient does not want a pregnancy in the next year.  Desired family size is 4 children.  - Reviewed reproductive life planning. Reviewed contraceptive methods based on pt preferences and effectiveness.  Patient desired Oral Contraceptive today.   - Discussed birth spacing of 18 months  4. Sleep and fatigue -Encouraged family/partner/community support of 4 hrs of uninterrupted sleep to help with mood and fatigue  5. Physical Recovery  - Discussed patients delivery and complications. She describes her labor as good. - Patient had a Vaginal, no problems at delivery. Patient had a  none  laceration. Perineal healing reviewed. Patient expressed understanding - Patient has urinary incontinence? no - Patient is safe to resume physical and sexual activity  6.  Health Maintenance - HM due items addressed Yes - Last pap smear  Diagnosis  Date Value Ref Range Status  09/09/2022   Final   - Negative for intraepithelial lesion or malignancy (NILM)   Pap smear not done at today's visit.  -Breast Cancer screening indicated? No.   7. Chronic Disease/Pregnancy Condition follow up: None  - PCP follow up  Sue Grates, FNP Center for [employer], [facility]

## 2023-03-23 ENCOUNTER — Emergency Department (HOSPITAL_COMMUNITY): Payer: Medicaid Other

## 2023-03-23 ENCOUNTER — Encounter (HOSPITAL_COMMUNITY): Payer: Self-pay

## 2023-03-23 ENCOUNTER — Other Ambulatory Visit: Payer: Self-pay

## 2023-03-23 ENCOUNTER — Emergency Department (HOSPITAL_COMMUNITY)
Admission: EM | Admit: 2023-03-23 | Discharge: 2023-03-23 | Disposition: A | Discharge status: Home / Self Care | Payer: Medicaid Other | Attending: Emergency Medicine | Admitting: Emergency Medicine

## 2023-03-23 ENCOUNTER — Emergency Department (HOSPITAL_COMMUNITY)
Admission: EM | Admit: 2023-03-23 | Discharge: 2023-03-23 | Discharge status: Against Medical Advice | Payer: Medicaid Other | Attending: Emergency Medicine | Admitting: Emergency Medicine

## 2023-03-23 ENCOUNTER — Encounter (HOSPITAL_COMMUNITY): Payer: Self-pay | Admitting: *Deleted

## 2023-03-23 ENCOUNTER — Ambulatory Visit (HOSPITAL_COMMUNITY)
Admission: EM | Admit: 2023-03-23 | Discharge: 2023-03-23 | Disposition: A | Discharge status: Home / Self Care | Payer: Medicaid Other

## 2023-03-23 DIAGNOSIS — R1084 Generalized abdominal pain: Secondary | ICD-10-CM | POA: Insufficient documentation

## 2023-03-23 DIAGNOSIS — M7989 Other specified soft tissue disorders: Secondary | ICD-10-CM | POA: Insufficient documentation

## 2023-03-23 DIAGNOSIS — K5900 Constipation, unspecified: Secondary | ICD-10-CM | POA: Insufficient documentation

## 2023-03-23 DIAGNOSIS — Z5321 Procedure and treatment not carried out due to patient leaving prior to being seen by health care provider: Secondary | ICD-10-CM | POA: Insufficient documentation

## 2023-03-23 DIAGNOSIS — R1030 Lower abdominal pain, unspecified: Secondary | ICD-10-CM

## 2023-03-23 DIAGNOSIS — Q742 Other congenital malformations of lower limb(s), including pelvic girdle: Secondary | ICD-10-CM

## 2023-03-23 HISTORY — DX: Unspecified ovarian cyst, unspecified side: N83.209

## 2023-03-23 LAB — CBC
HCT: 37.3 % (ref 36.0–46.0)
Hemoglobin: 11.9 g/dL — ABNORMAL LOW (ref 12.0–15.0)
MCH: 26.7 pg (ref 26.0–34.0)
MCHC: 31.9 g/dL (ref 30.0–36.0)
MCV: 83.8 fL (ref 80.0–100.0)
Platelets: 407 10*3/uL — ABNORMAL HIGH (ref 150–400)
RBC: 4.45 MIL/uL (ref 3.87–5.11)
RDW: 17.8 % — ABNORMAL HIGH (ref 11.5–15.5)
WBC: 13.1 10*3/uL — ABNORMAL HIGH (ref 4.0–10.5)
nRBC: 0 % (ref 0.0–0.2)

## 2023-03-23 LAB — URINALYSIS, ROUTINE W REFLEX MICROSCOPIC
Bacteria, UA: NONE SEEN
Bilirubin Urine: NEGATIVE
Glucose, UA: NEGATIVE mg/dL
Ketones, ur: NEGATIVE mg/dL
Leukocytes,Ua: NEGATIVE
Nitrite: NEGATIVE
Protein, ur: 30 mg/dL — AB
Specific Gravity, Urine: 1.018 (ref 1.005–1.030)
pH: 6 (ref 5.0–8.0)

## 2023-03-23 LAB — COMPREHENSIVE METABOLIC PANEL
ALT: 11 U/L (ref 0–44)
AST: 17 U/L (ref 15–41)
Albumin: 3.5 g/dL (ref 3.5–5.0)
Alkaline Phosphatase: 52 U/L (ref 38–126)
Anion gap: 11 (ref 5–15)
BUN: 6 mg/dL (ref 6–20)
CO2: 21 mmol/L — ABNORMAL LOW (ref 22–32)
Calcium: 8.9 mg/dL (ref 8.9–10.3)
Chloride: 104 mmol/L (ref 98–111)
Creatinine, Ser: 0.82 mg/dL (ref 0.44–1.00)
GFR, Estimated: >60 mL/min (ref >60)
Glucose, Bld: 102 mg/dL — ABNORMAL HIGH (ref 70–99)
Potassium: 4 mmol/L (ref 3.5–5.1)
Sodium: 136 mmol/L (ref 135–145)
Total Bilirubin: 0.3 mg/dL (ref 0.3–1.2)
Total Protein: 7.5 g/dL (ref 6.5–8.1)

## 2023-03-23 LAB — HCG, SERUM, QUALITATIVE: Preg, Serum: NEGATIVE

## 2023-03-23 LAB — LIPASE, BLOOD: Lipase: 22 U/L (ref 11–51)

## 2023-03-23 MED ORDER — ONDANSETRON 4 MG PO TBDP
4.0000 mg | ORAL_TABLET | Freq: Once | ORAL | Status: AC
  Administered 2023-03-23: 4 mg via ORAL
  Filled 2023-03-23: qty 1

## 2023-03-23 MED ORDER — HYDROMORPHONE HCL 1 MG/ML IJ SOLN
1.0000 mg | Freq: Once | INTRAMUSCULAR | Status: AC
  Administered 2023-03-23: 1 mg via INTRAVENOUS
  Filled 2023-03-23: qty 1

## 2023-03-23 MED ORDER — ONDANSETRON 4 MG PO TBDP: 4.0000 mg | ORAL_TABLET | Freq: Three times a day (TID) | ORAL | 0 refills | Status: DC | PRN

## 2023-03-23 MED ORDER — KETOROLAC TROMETHAMINE 30 MG/ML IJ SOLN
30.0000 mg | Freq: Once | INTRAMUSCULAR | Status: AC
  Administered 2023-03-23: 30 mg via INTRAVENOUS
  Filled 2023-03-23: qty 1

## 2023-03-23 MED ORDER — IOHEXOL 350 MG/ML SOLN
75.0000 mL | Freq: Once | INTRAVENOUS | Status: AC | PRN
  Administered 2023-03-23: 75 mL via INTRAVENOUS

## 2023-03-23 NOTE — ED Triage Notes (Signed)
Pt to ED from home c/o lower abdominal pain which began yesterday afternoon. Pt denies any NVD, states her last BM was yesterday morning. Arrives A+O, VSS.

## 2023-03-23 NOTE — ED Triage Notes (Signed)
Vaginal delivery on 01/25/23. Sent here by UC for further eval of mid lower abdominal pain radiating to both sides. Pt denies nausea, vomiting or diarrhea.

## 2023-03-23 NOTE — ED Provider Notes (Signed)
[facility]    CSN: [id number] Arrival date & time: 03/23/23  1244      History   Chief Complaint Chief Complaint  Patient presents with   Abdominal Pain    HPI Sue Lee is a 27 y.o. female.  Yesterday developed lower abdominal pain, cramping Rated 10/10 pain. Reports it "feels like labor pains" Hard to walk due to pain She is not having any vaginal bleeding or spotting No urinary symptoms Denies fever Last BM yesterday, normal  She tried tylenol and ibuprofen   Vaginal delivery 2 months ago on 01/25/2023  Was in the ED early this morning and had lab work, but left due to wait time Elevated white count 13.1, unremarkable CMP and normal lipase. Negative serum hCG  Past Medical History:  Diagnosis Date   Medical history non-contributory    Rubella non-immune status, antepartum 09/13/2022    Patient Active Problem List   Diagnosis Date Noted   Vaginal delivery 01/25/2023    Past Surgical History:  Procedure Laterality Date   WISDOM TOOTH EXTRACTION      OB History     Gravida  4   Para  3   Term  2   Preterm  1   AB  1   Living  3      SAB  1   IAB  0   Ectopic  0   Multiple  0   Live Births  3            Home Medications    Prior to Admission medications   Medication Sig Start Date End Date Taking? Authorizing Provider  acetaminophen (TYLENOL) 325 MG tablet Take 2 tablets (650 mg total) by mouth every 4 (four) hours as needed (for pain scale < 4). 01/27/23  Yes Mercado-Ortiz, Jessica Q, DO  ibuprofen (ADVIL) 600 MG tablet Take 1 tablet (600 mg total) by mouth every 6 (six) hours. 01/27/23  Yes Mercado-Ortiz, Lahoma Crocker, DO    Family History Family History  Problem Relation Age of Onset   Hypertension Mother    Diabetes Mother    Healthy Mother    Healthy Father    Diabetes Maternal Aunt    Diabetes Maternal Uncle    Hypertension Maternal Grandfather    Asthma Neg Hx    Cancer Neg Hx    Heart disease Neg Hx      Social History Social History   Tobacco Use   Smoking status: Former    Current packs/day: 0.00    Types: Cigarettes    Quit date: 08/07/2022    Years since quitting: 0.6    Passive exposure: Never   Smokeless tobacco: Never  Vaping Use   Vaping status: Never Used  Substance Use Topics   Alcohol use: Never   Drug use: Never     Allergies   Patient has no known allergies.   Review of Systems Review of Systems  Gastrointestinal:  Positive for abdominal pain.   Per HPI  Physical Exam Triage Vital Signs ED Triage Vitals [03/23/23 1407]  Encounter Vitals Group     BP      Systolic BP Percentile      Diastolic BP Percentile      Pulse      Resp      Temp      Temp src      SpO2      Weight      Height      Head  Circumference      Peak Flow      Pain Score 10     Pain Loc      Pain Education      Exclude from Growth Chart    No data found.  Updated Vital Signs BP 107/67 (BP Location: Left Arm)   Pulse 90   Temp 98.3 F (36.8 C) (Oral)   Resp 18   LMP 03/05/2023 (Exact Date)   SpO2 98%   Breastfeeding No     Physical Exam Vitals and nursing note reviewed.  Constitutional:      Appearance: Normal appearance.     Comments: Appears uncomfortable. Walks bent forwards  HENT:     Mouth/Throat:     Pharynx: Oropharynx is clear.  Cardiovascular:     Rate and Rhythm: Normal rate and regular rhythm.  Pulmonary:     Effort: Pulmonary effort is normal.  Abdominal:     Palpations: Abdomen is soft.     Tenderness: There is abdominal tenderness.  Musculoskeletal:        General: Normal range of motion.     Cervical back: Normal range of motion.  Skin:    General: Skin is warm and dry.  Neurological:     Mental Status: She is alert and oriented to person, place, and time.     UC Treatments / Results  Labs (all labs ordered are listed, but only abnormal results are displayed) Labs Reviewed - No data to display   EKG   Radiology No results  found.  Procedures Procedures (including critical care time)  Medications Ordered in UC Medications - No data to display  Initial Impression / Assessment and Plan / UC Course  I have reviewed the triage vital signs and the nursing notes.  Pertinent labs & imaging results that were available during my care of the patient were reviewed by me and considered in my medical decision making (see chart for details).  With severe abdominal pain and recent pregnancy, requires imaging not available in the urgent care setting. Needs ultrasound. Sent to ED for higher level of care. Accompanied by husband  Final Clinical Impressions(s) / UC Diagnoses   Final diagnoses:  Lower abdominal pain     Discharge Instructions      Sent to ED for imaging      ED Prescriptions   None    PDMP not reviewed this encounter.   Marlow Baars, [place] 03/23/23 1423

## 2023-03-23 NOTE — ED Triage Notes (Signed)
Pt states that she has lower abdominal pain since yesterday. She is having a hard time even walking. She has taken IBU and tylenol yesterday. States that the pain feels like labor pain. She did have a baby vaginally 01/25/23. She states no other sx.

## 2023-03-23 NOTE — Discharge Instructions (Addendum)
Your CT results as are below: "Diffuse colonic stool. Normal appendix. No bowel obstruction, free air or free fluid.   Fatty liver infiltration.   Ill-defined sclerotic area along the right femoral head superiorly. This is of uncertain etiology and significance. Please correlate for any prior to assess stability of the finding or follow up study may be useful when appropriate such as MRI." Please follow up this finding with your PCP within the next month. You may need to get an MRI for further evaluation  I suspect some constipation may be contributing to your abdominal pain. Drink plenty of fluids at home and eat a fiber fillet diet (lots of fruits and vegetables). Please engage in daily exercise as this also helps to maintain regular bowel movements.   Try to use the bathroom after eating a meal. Place your feet up on a small stepstool when trying to have a bowel movement.  Take 1 scoop of Miralax in the morning and 1 in the evening until you have a bowel movement. You may also take Ducolax (Bisacodyl) 2 tablets (10mg ) once daily in the morning. These are medications you can get over the counter at any drugstore.   Return to the ER if you still have not had a bowel movement within the next days, you begin vomiting, you no longer are passing gas.

## 2023-03-23 NOTE — ED Provider Notes (Signed)
[place] EMERGENCY DEPARTMENT AT [facility] Provider Note   CSN: [id number] Arrival date & time: 03/23/23  1440     History  Chief Complaint  Patient presents with   Abdominal Pain    Sue Lee is a 27 y.o. female who recently gave birth via vaginal delivery on 01/25/2023, who presents with concern of lower abdominal pain that started yesterday approximately 3 PM.  She denies any associated nausea or vomiting.  Denies any diarrhea.  Last bowel movement was yesterday morning.  Denies any hematuria, dysuria, increased frequency.  Denies any fevers or chills.  Denies any previous abdominal surgeries  She presented last night for work up but had to leave early due to childcare concerns.  She was evaluated this morning at urgent care and sent here for further imaging.   Abdominal Pain      Home Medications Prior to Admission medications   Medication Sig Start Date End Date Taking? Authorizing Provider  acetaminophen (TYLENOL) 325 MG tablet Take 2 tablets (650 mg total) by mouth every 4 (four) hours as needed (for pain scale < 4). 01/27/23   Mercado-Ortiz, Lahoma Crocker, DO  ibuprofen (ADVIL) 600 MG tablet Take 1 tablet (600 mg total) by mouth every 6 (six) hours. 01/27/23   Mercado-Ortiz, Lahoma Crocker, DO      Allergies    Patient has no known allergies.    Review of Systems   Review of Systems  Gastrointestinal:  Positive for abdominal pain.    Physical Exam Updated Vital Signs BP 107/72 (BP Location: Right Arm)   Pulse 92   Temp 98.8 F (37.1 C) (Oral)   Resp 15   Ht 5\' 2"  (1.575 m)   Wt 57.6 kg   LMP 03/05/2023 (Exact Date)   SpO2 100%   BMI 23.23 kg/m  Physical Exam Vitals and nursing note reviewed.  Constitutional:      General: She is not in acute distress.    Appearance: She is well-developed.  HENT:     Head: Normocephalic and atraumatic.  Eyes:     Conjunctiva/sclera: Conjunctivae normal.  Cardiovascular:     Rate and Rhythm: Normal rate and  regular rhythm.     Heart sounds: No murmur heard. Pulmonary:     Effort: Pulmonary effort is normal. No respiratory distress.     Breath sounds: Normal breath sounds.  Abdominal:     Palpations: Abdomen is soft.     Tenderness: There is abdominal tenderness.     Comments: Mildly tender in the right lower quadrant, moderately tender in the left lower quadrant No right upper quadrant or left upper quadrant tenderness to palpation  No CVA tenderness bilaterally  Abdomen soft and nondistended, no rebound or guarding   Musculoskeletal:        General: No swelling.     Cervical back: Neck supple.  Skin:    General: Skin is warm and dry.     Capillary Refill: Capillary refill takes less than 2 seconds.  Neurological:     Mental Status: She is alert.  Psychiatric:        Mood and Affect: Mood normal.     ED Results / Procedures / Treatments   Labs (all labs ordered are listed, but only abnormal results are displayed) Labs Reviewed - No data to display  EKG None  Radiology CT ABDOMEN PELVIS W CONTRAST  Result Date: 03/23/2023 CLINICAL DATA:  Left lower quadrant abdominal pain. EXAM: CT ABDOMEN AND PELVIS WITH CONTRAST TECHNIQUE:  Multidetector CT imaging of the abdomen and pelvis was performed using the standard protocol following bolus administration of intravenous contrast. RADIATION DOSE REDUCTION: This exam was performed according to the departmental dose-optimization program which includes automated exposure control, adjustment of the mA and/or kV according to patient size and/or use of iterative reconstruction technique. CONTRAST:  75mL OMNIPAQUE IOHEXOL 350 MG/ML SOLN COMPARISON:  None Available. FINDINGS: Lower chest: Lung bases are clear.  No pleural effusion. Hepatobiliary: Diffuse fatty liver infiltration. No space-occupying liver lesion. Gallbladder is nondilated. Patent portal vein. Pancreas: Unremarkable. No pancreatic ductal dilatation or surrounding inflammatory changes.  Spleen: Normal in size without focal abnormality. Adrenals/Urinary Tract: Adrenal glands are unremarkable. Kidneys are normal, without renal calculi, focal lesion, or hydronephrosis. Bladder is unremarkable. Stomach/Bowel: Diffuse colonic stool. No dilatation or obstruction. Normal appendix extends medial to the cecum in the right hemipelvis. There is moderate debris and fluid in the stomach. The small bowel is nondilated Vascular/Lymphatic: No significant vascular findings are present. No enlarged abdominal or pelvic lymph nodes. Reproductive: Uterus and bilateral adnexa are unremarkable. Other: No abdominal wall hernia or abnormality. No abdominopelvic ascites. Musculoskeletal: No fracture or dislocation. There is a area of asymmetric sclerosis along the right femoral head superiorly. Please correlate for any known history or additional evaluation when appropriate. IMPRESSION: Diffuse colonic stool. Normal appendix. No bowel obstruction, free air or free fluid. Fatty liver infiltration. Ill-defined sclerotic area along the right femoral head superiorly. This is of uncertain etiology and significance. Please correlate for any prior to assess stability of the finding or follow up study may be useful when appropriate such as MRI Electronically Signed   By: Karen Kays M.D.   On: 03/23/2023 16:28    Procedures Procedures    Medications Ordered in ED Medications  HYDROmorphone (DILAUDID) injection 1 mg (1 mg Intravenous Given 03/23/23 1512)  iohexol (OMNIPAQUE) 350 MG/ML injection 75 mL (75 mLs Intravenous Contrast Given 03/23/23 1532)  ketorolac (TORADOL) 30 MG/ML injection 30 mg (30 mg Intravenous Given 03/23/23 1728)    ED Course/ Medical Decision Making/ A&P                                 Medical Decision Making Amount and/or Complexity of Data Reviewed Radiology: ordered.  Risk Prescription drug management.   27 y.o. female with pertinent past medical history of vaginal delivery on  01/25/2023 presents to the ED for concern of abdominal pain that started yesterday  Differential diagnosis includes but is not limited to diverticulitis, pancreatitis, appendicitis, cholecystitis, UTI  ED Course:  Patient presents with concern for diffuse abdominal pain that started yesterday.  She denies any other associated symptoms.  She has been hemodynamically stable, vital signs within normal limits.  Overall workup has been unremarkable.  Urinalysis without sign of UTI, serum pregnancy negative, CMP without any elevations in LFTs, lipase within normal limits.  CBC with slight leukocytosis at 13.  Her pain has improved slightly with IV Dilaudid and Toradol.  CT without any signs of acute abdominal pathology.  Given labs overall unremarkable and CT without any acute findings, low suspicion for acute abdominal pathology requiring further testing or inpatient treatment.  CT does show diffuse colonic stool, patient does state she has been having some difficulty with bowel movements.  This may be contributing to her pain.  Will discharge with instructions to take 1 scoop of MiraLAX each morning and evening to help with bowel movements.  I discussed with her that her CT showed a ill-defined sclerotic area on her right femoral head.  I discussed that it is unknown what this is at this time, but she needs to follow this up with her PCP as they will likely have to get MRI for further imaging to evaluate.  She verbalizes understanding.  I do not think this is the cause of her pain at this time given she is not having any leg pain.   Impression: Constipation Abdominal pain Sclerotic changes of the right femoral head  Disposition:  The patient was discharged home with instructions to take MiraLAX as instructed.  Follow-up with PCP within the next month for further MRI imaging to evaluate the femur sclerotic changes. Return precautions given.  Lab Tests: I Ordered, and personally interpreted labs.  The  pertinent results include:   Serum hCG negative CBC with leukocytosis of 13.1 CMP unremarkable Lipase within normal limits Urinalysis with proteinuria, no nitrates or leukocytes  Imaging Studies ordered: I ordered imaging studies including CT abdomen pelvis I independently visualized the imaging with scope of interpretation limited to determining acute life threatening conditions related to emergency care. Imaging showed no acute abdominal pathology, sclerotic area of the right femoral head I agree with the radiologist interpretation    Co morbidities that complicate the patient evaluation  Recent uncomplicated vaginal delivery on 01/25/2023   I reviewed the recent note from urgent care            Final Clinical Impression(s) / ED Diagnoses Final diagnoses:  Constipation, unspecified constipation type  Generalized abdominal pain  Abnormality of femur    Rx / DC Orders ED Discharge Orders     None         Arabella Merles, PA-C 03/23/23 1802    Derwood Kaplan, MD 03/29/23 [phone]

## 2023-03-23 NOTE — ED Notes (Signed)
Patient states she wishes to leave AMA, states she will go to urgent care tomorrow, PA made aware.

## 2023-03-23 NOTE — ED Notes (Signed)
Patient is being discharged from the Urgent Care and sent to the Emergency Department via POV . Per [employer], PA, patient is in need of higher level of care due to abdominal pain. Patient is aware and verbalizes understanding of plan of care.  Vitals:   03/23/23 1408  BP: 107/67  Pulse: 90  Resp: 18  Temp: 98.3 F (36.8 C)  SpO2: 98%

## 2023-03-23 NOTE — Discharge Instructions (Signed)
Sent to ED for imaging

## 2023-03-23 NOTE — ED Notes (Signed)
Patient requesting a provided, PA made aware.

## 2023-06-04 ENCOUNTER — Encounter: Payer: Self-pay | Admitting: *Deleted

## 2023-06-04 ENCOUNTER — Ambulatory Visit: Payer: Medicaid Other

## 2023-06-04 DIAGNOSIS — N912 Amenorrhea, unspecified: Secondary | ICD-10-CM

## 2023-06-04 DIAGNOSIS — Z3200 Encounter for pregnancy test, result unknown: Secondary | ICD-10-CM

## 2023-06-04 DIAGNOSIS — O3680X0 Pregnancy with inconclusive fetal viability, not applicable or unspecified: Secondary | ICD-10-CM

## 2023-06-04 DIAGNOSIS — Z3201 Encounter for pregnancy test, result positive: Secondary | ICD-10-CM

## 2023-06-04 LAB — POCT PREGNANCY, URINE: Preg Test, Ur: POSITIVE — AB

## 2023-06-04 NOTE — Progress Notes (Signed)
Possible Pregnancy  Here today for pregnancy confirmation. UPT in office today is positive. Pt reports first positive home UPT on pregnancy. Reviewed dating with patient:   LMP: 04/05/23 EDD: 01/10/24 8w 4d today  Reports 3 menstrual periods during postpartum period that were regular. OB history reviewed; 3 full term vaginal birth and 1 SAB. Most recent birth was 01/25/23. Reviewed medications and allergies with patient. Offered 1st trimester [place] to confirm dating; pt scheduled for tomorrow. Will send front office staff a message to schedule new OB visits.  Marjo Bicker, RN 06/04/2023  4:45 PM

## 2023-06-04 NOTE — Progress Notes (Signed)
Pt attended 11/23/22 screening event where her b/p was 96/56. During the event, the pt documented SDOH needs she denied during her initial f/u (and then phone hung up) - this info sent to pt's OB/GYN who has been serving as her PCP, Albertine Grates, FNP at [facility] for health, for f/u. 60 day event f/u chart review revealed pt had a normal vaginal delivery on 01/25/23 and no further SDOH were noted. Pt had 6 wk post partum exam on 03/14/23. Since health equity team member unable to contact pt at both 60 day and 6 month event f/u, and chart review does not indicate that pt has been seen by a primary care provider that is visible in [facility], final letter sent to pt with [facility] and community primary clinic flyers, in case she is ready to establish care with a PCP and in-basket message sent to [facility] for ongoing f/u. No additional health equity team support scheduled at this time.

## 2023-06-05 ENCOUNTER — Other Ambulatory Visit: Payer: Self-pay

## 2023-06-05 ENCOUNTER — Other Ambulatory Visit: Payer: Medicaid Other

## 2023-06-05 DIAGNOSIS — O3680X0 Pregnancy with inconclusive fetal viability, not applicable or unspecified: Secondary | ICD-10-CM

## 2023-06-05 DIAGNOSIS — Z3491 Encounter for supervision of normal pregnancy, unspecified, first trimester: Secondary | ICD-10-CM

## 2023-06-05 DIAGNOSIS — Z3A08 8 weeks gestation of pregnancy: Secondary | ICD-10-CM | POA: Diagnosis not present

## 2023-06-12 ENCOUNTER — Telehealth: Payer: Self-pay | Admitting: *Deleted

## 2023-06-12 ENCOUNTER — Telehealth: Payer: Medicaid Other

## 2023-06-12 NOTE — Telephone Encounter (Signed)
Sue Lee not yet logged in for virtual visit. I called Sue Lee to see if she is ready to begin her virtual new ob intake appointment and she informed me she would like to reschedule. I informed her I will have front office call her to reschedule. Nancy Fetter

## 2023-06-17 ENCOUNTER — Telehealth: Payer: Medicaid Other

## 2023-06-17 DIAGNOSIS — Z3480 Encounter for supervision of other normal pregnancy, unspecified trimester: Secondary | ICD-10-CM | POA: Insufficient documentation

## 2023-06-17 DIAGNOSIS — Z3689 Encounter for other specified antenatal screening: Secondary | ICD-10-CM | POA: Diagnosis not present

## 2023-06-17 NOTE — Progress Notes (Signed)
New OB Intake  I connected with Quinn Plowman  on 06/17/23 at  1:15 PM EST by MyChart Video Visit and verified that I am speaking with the correct person using two identifiers. Nurse is located at [facility] and pt is located at home .  I discussed the limitations, risks, security and privacy concerns of performing an evaluation and management service by telephone and the availability of in person appointments. I also discussed with the patient that there may be a patient responsible charge related to this service. The patient expressed understanding and agreed to proceed.  I explained I am completing New OB Intake today. We discussed EDD of 01/10/2024, by Last Menstrual Period. Pt is [id number]. I reviewed her allergies, medications and Medical/Surgical/OB history.    Patient Active Problem List   Diagnosis Date Noted   Supervision of other normal pregnancy, antepartum 06/17/2023   Vaginal delivery 01/25/2023    Concerns addressed today  Delivery Plans Plans to deliver at [facility]. Discussed the nature of our practice with multiple providers including residents and students. Due to the size of the practice, the delivering provider may not be the same as those providing prenatal care.   Patient is not interested in water birth. Offered upcoming OB visit with CNM to discuss further.  MyChart/Babyscripts MyChart access verified. I explained pt will have some visits in office and some virtually. Babyscripts instructions given and order placed. Patient verifies receipt of registration text/e-mail. Account successfully created and app downloaded.  Blood Pressure Cuff/Weight Scale Pt has own BP Cuff, Explained after first prenatal appt pt will check weekly and document in Babyscripts. Patient does have weight scale.  Anatomy US Explained first scheduled [place] will be around 19 weeks. Anatomy US scheduled for 08/18/23 at 0815a.  Is patient a CenteringPregnancy candidate?  Declined Declined due to  Schedule& childcare( Stated tried it before & it didn't work out)   Is patient a Mom+Baby Combined Care candidate?  Not a candidate   If accepted, confirm patient does not intend to move from the area for at least 12 months, then notify Mom+Baby staff  Interested in [place]? If yes, send referral and doula dot phrase.   Is patient a candidate for Babyscripts Optimization? Yes  First visit review I reviewed new OB appt with patient. Explained pt will be seen by Santo Held, CNM at first visit. Discussed Avelina Laine genetic screening with patient. needs Panorama and Horizon.. Routine prenatal labs  needed at [facility] OB visit.    Last Pap Diagnosis  Date Value Ref Range Status  09/09/2022   Final   - Negative for intraepithelial lesion or malignancy (NILM)    Henrietta Dine, CMA 06/17/2023  1:43 PM

## 2023-06-19 ENCOUNTER — Ambulatory Visit: Payer: Medicaid Other | Admitting: Certified Nurse Midwife

## 2023-06-19 ENCOUNTER — Other Ambulatory Visit: Payer: Self-pay

## 2023-06-19 ENCOUNTER — Other Ambulatory Visit (HOSPITAL_COMMUNITY)
Admission: RE | Admit: 2023-06-19 | Discharge: 2023-06-19 | Disposition: A | Discharge status: Home / Self Care | Payer: Medicaid Other | Source: Ambulatory Visit | Attending: Certified Nurse Midwife | Admitting: Certified Nurse Midwife

## 2023-06-19 ENCOUNTER — Telehealth: Payer: Self-pay

## 2023-06-19 VITALS — BP 111/71 | HR 97 | Wt 130.5 lb

## 2023-06-19 DIAGNOSIS — Z3A10 10 weeks gestation of pregnancy: Secondary | ICD-10-CM | POA: Diagnosis not present

## 2023-06-19 DIAGNOSIS — Z3009 Encounter for other general counseling and advice on contraception: Secondary | ICD-10-CM

## 2023-06-19 DIAGNOSIS — O09891 Supervision of other high risk pregnancies, first trimester: Secondary | ICD-10-CM | POA: Diagnosis present

## 2023-06-19 DIAGNOSIS — Z3490 Encounter for supervision of normal pregnancy, unspecified, unspecified trimester: Secondary | ICD-10-CM

## 2023-06-19 DIAGNOSIS — Z641 Problems related to multiparity: Secondary | ICD-10-CM | POA: Diagnosis not present

## 2023-06-19 DIAGNOSIS — O0991 Supervision of high risk pregnancy, unspecified, first trimester: Secondary | ICD-10-CM | POA: Diagnosis present

## 2023-06-19 DIAGNOSIS — O09211 Supervision of pregnancy with history of pre-term labor, first trimester: Secondary | ICD-10-CM | POA: Diagnosis present

## 2023-06-19 DIAGNOSIS — O09893 Supervision of other high risk pregnancies, third trimester: Secondary | ICD-10-CM | POA: Insufficient documentation

## 2023-06-19 NOTE — Patient Instructions (Signed)
[facility] [place] Address: [address], [place], [place] [address] Hours:  Monday 9AM-5PM Tuesday 10AM-6PM Wednesday 11AM-7PM Thursday 9AM-5PM Friday              8AM-2PM Saturday Sunday  Phone: [phone]  

## 2023-06-20 LAB — CBC/D/PLT+RPR+RH+ABO+RUBIGG...
Antibody Screen: NEGATIVE
Basophils Absolute: 0 10*3/uL (ref 0.0–0.2)
Basos: 0 %
EOS (ABSOLUTE): 0.1 10*3/uL (ref 0.0–0.4)
Eos: 1 %
HCV Ab: NONREACTIVE
HIV Screen 4th Generation wRfx: NONREACTIVE
Hematocrit: 39 % (ref 34.0–46.6)
Hemoglobin: 12.8 g/dL (ref 11.1–15.9)
Hepatitis B Surface Ag: NEGATIVE
Immature Grans (Abs): 0 10*3/uL (ref 0.0–0.1)
Immature Granulocytes: 0 %
Lymphocytes Absolute: 2.4 10*3/uL (ref 0.7–3.1)
Lymphs: 33 %
MCH: 27.6 pg (ref 26.6–33.0)
MCHC: 32.8 g/dL (ref 31.5–35.7)
MCV: 84 fL (ref 79–97)
Monocytes Absolute: 0.4 10*3/uL (ref 0.1–0.9)
Monocytes: 6 %
Neutrophils Absolute: 4.3 10*3/uL (ref 1.4–7.0)
Neutrophils: 60 %
Platelets: 297 10*3/uL (ref 150–450)
RBC: 4.63 x10E6/uL (ref 3.77–5.28)
RDW: 15.9 % — ABNORMAL HIGH (ref 11.7–15.4)
RPR Ser Ql: NONREACTIVE
Rh Factor: POSITIVE
Rubella Antibodies, IGG: 2.33 {index} (ref >0.99)
WBC: 7.2 10*3/uL (ref 3.4–10.8)

## 2023-06-20 LAB — GC/CHLAMYDIA PROBE AMP (~~LOC~~) NOT AT ARMC
Chlamydia: NEGATIVE
Comment: NEGATIVE
Comment: NORMAL
Neisseria Gonorrhea: NEGATIVE

## 2023-06-20 LAB — HEMOGLOBIN A1C
Est. average glucose Bld gHb Est-mCnc: 131 mg/dL
Hgb A1c MFr Bld: 6.2 % — ABNORMAL HIGH (ref 4.8–5.6)

## 2023-06-20 LAB — HCV INTERPRETATION

## 2023-06-22 LAB — URINE CULTURE, OB REFLEX

## 2023-06-22 LAB — CULTURE, OB URINE

## 2023-06-22 NOTE — Progress Notes (Signed)
History:   Sue Lee is a 27 y.o. [phone] at [redacted]w[redacted]d by LMP being seen today for her first obstetrical visit.  Her obstetrical history is significant for  Preterm labor at 36 weeks and short interval between pregnancies. Patient unsure if planning to continue with this pregnancy.   . Patient does intend to breast feed. Pregnancy history fully reviewed.  Patient reports no complaints.      HISTORY: OB History  Gravida Para Term Preterm AB Living  5 3 2 1 1 3   SAB IAB Ectopic Multiple Live Births  1 0 0 0 3    # Outcome Date GA Lbr Len/2nd Weight Sex Type Anes PTL Lv  5 Current           4 Preterm 01/25/23 [redacted]w[redacted]d 10:42 / 00:10 5 lb 13.1 oz (2.64 kg) F Vag-Spont EPI  LIV     Birth Comments: WDL     Name: TACOYA, CLINCH     Apgar1: 9  Apgar5: 9  3 SAB 2020          2 Term 02/29/16    M Vag-Spont   LIV  1 Term 02/13/14    M Vag-Spont   LIV    Last pap smear was done 09/11/22 and was normal  Past Medical History:  Diagnosis Date   Medical history non-contributory    Ovarian cyst    Rubella non-immune status, antepartum 09/13/2022   Past Surgical History:  Procedure Laterality Date   WISDOM TOOTH EXTRACTION     Family History  Problem Relation Age of Onset   Hypertension Mother    Diabetes Mother    Healthy Mother    Healthy Father    Diabetes Maternal Aunt    Diabetes Maternal Uncle    Hypertension Maternal Grandfather    Asthma Neg Hx    Cancer Neg Hx    Heart disease Neg Hx    Social History   Tobacco Use   Smoking status: Every Day    Current packs/day: 0.50    Average packs/day: 0.5 packs/day for 2.9 years (1.4 ttl pk-yrs)    Types: Cigarettes    Start date: 2022    Passive exposure: Never   Smokeless tobacco: Never  Vaping Use   Vaping status: Never Used  Substance Use Topics   Alcohol use: Never   Drug use: Never   No Known Allergies Current Outpatient Medications on File Prior to Visit  Medication Sig Dispense Refill   Prenatal Vit-Fe  Fumarate-FA (MULTIVITAMIN-PRENATAL) 27-0.8 MG TABS tablet Take 1 tablet by mouth daily at 12 noon.     acetaminophen (TYLENOL) 325 MG tablet Take 2 tablets (650 mg total) by mouth every 4 (four) hours as needed (for pain scale < 4). 100 tablet 0   ibuprofen (ADVIL) 600 MG tablet Take 1 tablet (600 mg total) by mouth every 6 (six) hours. (Patient not taking: Reported on 06/04/2023) 30 tablet 0   ondansetron (ZOFRAN-ODT) 4 MG disintegrating tablet Take 1 tablet (4 mg total) by mouth every 8 (eight) hours as needed for nausea or vomiting. (Patient not taking: Reported on 06/04/2023) 20 tablet 0   No current facility-administered medications on file prior to visit.    Review of Systems Pertinent items noted in HPI and remainder of comprehensive ROS otherwise negative.  Indications for ASA therapy (per UpToDate) One of the following: Previous pregnancy with preeclampsia, especially early onset and with an adverse outcome No Multifetal gestation No Chronic hypertension No Type 1 or  2 diabetes mellitus No Chronic kidney disease No Autoimmune disease (antiphospholipid syndrome, systemic lupus erythematosus) No Two or more of the following: Nulliparity No Obesity (body mass index >30 kg/m2) No Family history of preeclampsia in mother or sister no Age >=35 years No Sociodemographic characteristics (African American race, low socioeconomic level) Yes Personal risk factors (eg, previous pregnancy with low birth weight or small for gestational age infant, previous adverse pregnancy outcome [eg, stillbirth], interval >10 years between pregnancies) No  Indications for early GDM screening (FBS, A1C, Random CBG, GTT) First-degree relative with diabetes Yes BMI >30kg/m2 No Age > 35 No Previous birth of an infant weighing >=4000 g No Gestational diabetes mellitus in a previous pregnancy No Glycated hemoglobin >=5.7 percent (39 mmol/mol), impaired glucose tolerance, or impaired fasting glucose on previous  testing No High-risk race/ethnicity (eg, African American, Latino, Native American, Asian American, Pacific Islander) Yes Previous stillbirth of unknown cause No Maternal birthweight > 9 lbs No History of cardiovascular disease No Hypertension or on therapy for hypertension No High-density lipoprotein cholesterol level <35 mg/dL (5.40 mmol/L) and/or a triglyceride level >250 mg/dL (9.81 mmol/L) No Polycystic ovary syndrome No Physical inactivity No Other clinical condition associated with insulin resistance (eg, severe obesity, acanthosis nigricans) No Current use of glucocorticoids No   Physical Exam:   Vitals:   06/19/23 0944  BP: 111/71  Pulse: 97  Weight: 130 lb 8 oz (59.2 kg)   Fetal Heart Rate (bpm): 160   General: well-developed, well-nourished female in no acute distress     Skin: normal coloration and turgor, no rashes  Neurologic: oriented, normal, negative, normal mood  Extremities: normal strength, tone, and muscle mass, ROM of all joints is normal  HEENT PERRLA, extraocular movement intact and sclera clear, anicteric  Neck supple and no masses  Cardiovascular: regular rate and rhythm  Respiratory:  no respiratory distress, normal breath sounds  Abdomen: soft, non-tender; bowel sounds normal; no masses,  no organomegaly       Assessment:    Pregnancy: [id number] Patient Active Problem List   Diagnosis Date Noted   Short interval between pregnancies affecting pregnancy in first trimester, antepartum 06/19/2023   Supervision of other normal pregnancy, antepartum 06/17/2023   Vaginal delivery 01/25/2023     Plan:    1. Supervision of high risk pregnancy in first trimester - Patient overall doing well.    2. Grand multipara - Patient undecided if she would like to continue with this pregnancy. Reviewed new [place] guidelines for termination and provided resources as requested.  - Offered to postpone NOB labs and patient declined.    3. [redacted] weeks gestation of  pregnancy 8. Unwanted fertility - NOB labs collected today at patient request  - Culture, OB Urine - Hemoglobin A1c - CBC/D/Plt+RPR+Rh+ABO+RubIgG... - GC/Chlamydia probe amp ([place])not at [facility]  4. Birth control counseling - Patient considering Nexplanon for birth control if she decides to terminate.    5. Current pregnancy with history of pre-term labor in first trimester  6. Short interval between pregnancies affecting pregnancy in first trimester, antepartum  7. Pregnancy - PANORAMA PRENATAL TEST      Initial labs drawn. Continue prenatal vitamins. Problem list reviewed and updated. Genetic Screening discussed, Panorama: requested. Ultrasound discussed; fetal anatomic survey: planned. Anticipatory guidance about prenatal visits given including labs, ultrasounds, and testing. Weight gain recommendations per IOM guidelines reviewed: underweight/BMI 18.5 or less > 28 - 40 lbs; normal weight/BMI 18.5 - 24.9 > 25 - 35 lbs; overweight/BMI 25 -  29.9 > 15 - 25 lbs; obese/BMI  30 or more > 11 - 20 lbs. Discussed usage of the Babyscripts app for more information about pregnancy, and to track blood pressures. Also discussed usage of virtual visits as additional source of managing and completing prenatal visits.  Patient was encouraged to use MyChart to review results, send requests, and have questions addressed.   The nature of Center for [facility]Faculty Practice with multiple MDs and Advanced Practice Providers was explained to patient; also emphasized that residents, students are part of our team. Routine obstetric precautions reviewed. Encouraged to seek out care at our office or emergency room [facility] preferred) for urgent and/or emergent concerns. Return in about 4 weeks (around 07/17/2023) for LOB.     Jaysie Benthall Danella Deis) Suzie Portela, MSN, CNM  Center for [facility]  06/22/2023 11:53 AM

## 2023-06-23 ENCOUNTER — Encounter: Payer: Self-pay | Admitting: *Deleted

## 2023-06-25 LAB — PANORAMA PRENATAL TEST FULL PANEL:PANORAMA TEST PLUS 5 ADDITIONAL MICRODELETIONS: FETAL FRACTION: 12.4

## 2023-07-17 ENCOUNTER — Other Ambulatory Visit: Payer: Self-pay

## 2023-07-17 ENCOUNTER — Ambulatory Visit: Payer: Medicaid Other | Admitting: Certified Nurse Midwife

## 2023-07-17 VITALS — BP 106/66 | HR 98 | Wt 136.0 lb

## 2023-07-17 DIAGNOSIS — O09892 Supervision of other high risk pregnancies, second trimester: Secondary | ICD-10-CM

## 2023-07-17 DIAGNOSIS — O99810 Abnormal glucose complicating pregnancy: Secondary | ICD-10-CM

## 2023-07-17 DIAGNOSIS — Z641 Problems related to multiparity: Secondary | ICD-10-CM

## 2023-07-17 DIAGNOSIS — O0992 Supervision of high risk pregnancy, unspecified, second trimester: Secondary | ICD-10-CM

## 2023-07-17 DIAGNOSIS — Z3A14 14 weeks gestation of pregnancy: Secondary | ICD-10-CM

## 2023-07-17 DIAGNOSIS — E669 Obesity, unspecified: Secondary | ICD-10-CM

## 2023-07-17 DIAGNOSIS — Z3492 Encounter for supervision of normal pregnancy, unspecified, second trimester: Secondary | ICD-10-CM

## 2023-07-17 DIAGNOSIS — Z3009 Encounter for other general counseling and advice on contraception: Secondary | ICD-10-CM

## 2023-07-17 DIAGNOSIS — O09212 Supervision of pregnancy with history of pre-term labor, second trimester: Secondary | ICD-10-CM

## 2023-07-17 DIAGNOSIS — R7309 Other abnormal glucose: Secondary | ICD-10-CM

## 2023-07-17 NOTE — Progress Notes (Signed)
   PRENATAL VISIT NOTE  Subjective:  Sue Lee is a 27 y.o. [phone] at [redacted]w[redacted]d being seen today for ongoing prenatal care.  She is currently monitored for the following issues for this low-risk pregnancy and has Vaginal delivery; Supervision of other normal pregnancy, antepartum; and Short interval between pregnancies affecting pregnancy in first trimester, antepartum on their problem list.  Patient reports no complaints.   . Vag. Bleeding: None.   . Denies leaking of fluid.   The following portions of the patient's history were reviewed and updated as appropriate: allergies, current medications, past family history, past medical history, past social history, past surgical history and problem list.   Objective:   Vitals:   07/17/23 1023  BP: 106/66  Pulse: 98  Weight: 136 lb (61.7 kg)    Fetal Status: Fetal Heart Rate (bpm): 144 Fundal Height: 34 cm       General:  Alert, oriented and cooperative. Patient is in no acute distress.  Skin: Skin is warm and dry. No rash noted.   Cardiovascular: Normal heart rate noted  Respiratory: Normal respiratory effort, no problems with respiration noted  Abdomen: Soft, gravid, appropriate for gestational age.  Pain/Pressure: Absent     Pelvic: Cervical exam deferred        Extremities: Normal range of motion.  Edema: None  Mental Status: Normal mood and affect. Normal behavior. Normal judgment and thought content.   Assessment and Plan:  Pregnancy: [id number] at [redacted]w[redacted]d 1. Encounter for supervision of low-risk pregnancy in second trimester (Primary) - Doing well, no concerns.   2. [redacted] weeks gestation of pregnancy - Routine prenatal care.   3. Grand multipara 4. Unwanted fertility - Considering LARC after birth.   5. Short interval between pregnancies complicating pregnancy in second trimester, antepartum - Discussed optimal nutrition, stress reduction.   6. Current pregnancy with history of pre-term labor in second trimester - Counseled on  s/s of preterm labor.  7. Elevated Hbg A1c on initial labs  - Ordered early Gtt, message to front desk to schedule ASAP.  - Lab draw ordered 07/21/23.   Preterm labor symptoms and general obstetric precautions including but not limited to vaginal bleeding, contractions, leaking of fluid and fetal movement were reviewed in detail with the patient. Please refer to After Visit Summary for other counseling recommendations.   Return in about 1 month (around 08/18/2023), or Please schedule after [place] appointment, for Also needs 2hr GTT ASAP.  Future Appointments  Date Time Provider Department Center  07/21/2023  8:20 AM [facility] LAB [facility]  08/18/2023  8:15 AM [facility] NURSE [facility]  08/18/2023  8:30 AM [facility] US1 [facility]  08/18/2023 10:15 AM Sundra Aland, MD [facility]    Richardson Landry, CNM

## 2023-07-21 ENCOUNTER — Other Ambulatory Visit: Payer: Medicaid Other

## 2023-07-21 ENCOUNTER — Other Ambulatory Visit: Payer: Self-pay

## 2023-07-21 DIAGNOSIS — R7309 Other abnormal glucose: Secondary | ICD-10-CM

## 2023-07-22 LAB — GLUCOSE TOLERANCE, 2 HOURS W/ 1HR
Glucose, 1 hour: 148 mg/dL (ref 70–179)
Glucose, 2 hour: 90 mg/dL (ref 70–152)
Glucose, Fasting: 86 mg/dL (ref 70–91)

## 2023-07-23 ENCOUNTER — Encounter: Payer: Self-pay | Admitting: Certified Nurse Midwife

## 2023-07-30 NOTE — L&D Delivery Note (Signed)
 OB/GYN Faculty Practice Delivery Note  Sue Lee is a 28 y.o. [id number] s/p SVD at [redacted]w[redacted]d. She was admitted for SOL.   ROM: 0h 91m with clear fluid GBS Status: negative Maximum Maternal Temperature: 98.4  Labor Progress: Patient presented in active labor, had spontaneous rupture of membranes.  An epidural was placed and labor progressed without any complications.  Delivery Date/Time: 01/01/2024 @ 0930 Delivery: Called to room and patient was complete and pushing. Head delivered ROA. No nuchal cord present. Shoulder and body delivered in usual fashion. Infant with spontaneous cry, placed on mother's abdomen, dried and stimulated. Cord clamped x 2 after 1-minute delay, and cut by FOB. Cord blood drawn. Placenta delivered spontaneously, intact, with 3-vessel cord. Fundus firm with massage and Pitocin . Labia, perineum, vagina, and cervix inspected, first degree labial laceration found and repaired in the usual fashion.   Placenta: 3 vessel cord intact Complications: none Lacerations: first degree left labial tear repaired in usual fashion EBL: 204 Analgesia: epidural  Postpartum Planning [x]  transfer orders to MB [ ]  discharge summary started & shared [ ]  message to sent to schedule follow-up  [x]  lists updated  Infant:   APGARs 9 and 9   weight pending  [name] MD [facility] Resident PGY-1  [facility] 01/01/24 10:08 AM

## 2023-08-18 ENCOUNTER — Ambulatory Visit: Payer: Medicaid Other | Attending: Certified Nurse Midwife

## 2023-08-18 ENCOUNTER — Ambulatory Visit: Payer: Medicaid Other

## 2023-08-18 ENCOUNTER — Other Ambulatory Visit: Payer: Self-pay

## 2023-08-18 ENCOUNTER — Other Ambulatory Visit: Payer: Self-pay | Admitting: *Deleted

## 2023-08-18 ENCOUNTER — Ambulatory Visit (INDEPENDENT_AMBULATORY_CARE_PROVIDER_SITE_OTHER): Payer: Medicaid Other | Admitting: Family Medicine

## 2023-08-18 VITALS — BP 123/66 | HR 85 | Wt 144.7 lb

## 2023-08-18 DIAGNOSIS — Z3482 Encounter for supervision of other normal pregnancy, second trimester: Secondary | ICD-10-CM | POA: Diagnosis not present

## 2023-08-18 DIAGNOSIS — O321XX0 Maternal care for breech presentation, not applicable or unspecified: Secondary | ICD-10-CM | POA: Diagnosis not present

## 2023-08-18 DIAGNOSIS — Z3480 Encounter for supervision of other normal pregnancy, unspecified trimester: Secondary | ICD-10-CM | POA: Diagnosis present

## 2023-08-18 DIAGNOSIS — O09891 Supervision of other high risk pregnancies, first trimester: Secondary | ICD-10-CM

## 2023-08-18 DIAGNOSIS — O09899 Supervision of other high risk pregnancies, unspecified trimester: Secondary | ICD-10-CM

## 2023-08-18 DIAGNOSIS — Z3A19 19 weeks gestation of pregnancy: Secondary | ICD-10-CM

## 2023-08-18 DIAGNOSIS — O09292 Supervision of pregnancy with other poor reproductive or obstetric history, second trimester: Secondary | ICD-10-CM | POA: Diagnosis not present

## 2023-08-18 NOTE — Progress Notes (Signed)
   PRENATAL VISIT NOTE  Subjective:  Sue Lee is a 28 y.o. [phone] at [redacted]w[redacted]d being seen today for ongoing prenatal care.  She is currently monitored for the following issues for this low-risk pregnancy and has Vaginal delivery; Supervision of other normal pregnancy, antepartum; and Short interval between pregnancies affecting pregnancy in first trimester, antepartum on their problem list.  Patient reports no complaints.  Contractions: Not present. Vag. Bleeding: None.  Movement: Present. Denies leaking of fluid.   The following portions of the patient's history were reviewed and updated as appropriate: allergies, current medications, past family history, past medical history, past social history, past surgical history and problem list.   Objective:   Vitals:   08/18/23 1000  BP: 123/66  Pulse: 85  Weight: 144 lb 11.2 oz (65.6 kg)    Fetal Status: Fetal Heart Rate (bpm): 145   Movement: Present     General:  Alert, oriented and cooperative. Patient is in no acute distress.  Skin: Skin is warm and dry. No rash noted.   Cardiovascular: Normal heart rate noted  Respiratory: Normal respiratory effort, no problems with respiration noted  Abdomen: Soft, gravid, appropriate for gestational age.  Pain/Pressure: Present     Pelvic: Cervical exam deferred        Extremities: Normal range of motion.  Edema: None  Mental Status: Normal mood and affect. Normal behavior. Normal judgment and thought content.   Assessment and Plan:  Pregnancy: [id number] at [redacted]w[redacted]d Supervision of other normal pregnancy, antepartum  [redacted] weeks gestation of pregnancy Prenatal course reviewed BP, HR, FHR normal Beginning to feel FM   Short interval between pregnancies affecting pregnancy in first trimester, antepartum PTL symptoms reviewed   Preterm labor symptoms and general obstetric precautions including but not limited to vaginal bleeding, contractions, leaking of fluid and fetal movement were reviewed in  detail with the patient. Please refer to After Visit Summary for other counseling recommendations.   Return in about 4 weeks (around 09/15/2023) for Routine prenatal care, In person, MD or APP.  Future Appointments  Date Time Provider Department Center  09/22/2023  3:30 PM [facility] US5 [facility]    Sundra Aland, MD

## 2023-08-20 LAB — AFP, SERUM, OPEN SPINA BIFIDA
AFP MoM: 1.33
AFP Value: 76.4 ng/mL
Gest. Age on Collection Date: 19.3 wk
Maternal Age At EDD: 28.6 a
OSBR Risk 1 IN: 8798
Test Results:: NEGATIVE
Weight: 144 [lb_av]

## 2023-09-14 NOTE — Progress Notes (Unsigned)
   PRENATAL VISIT NOTE  Subjective:  Sue Lee is a 28 y.o. [phone] at [redacted]w[redacted]d being seen today for ongoing prenatal care.  She is currently monitored for the following issues for this {Blank single:19197::"high-risk","low-risk"} pregnancy and has Vaginal delivery; Supervision of other normal pregnancy, antepartum; and Short interval between pregnancies affecting pregnancy in first trimester, antepartum on their problem list.  Patient reports {sx:14538}.   .  .   . Denies leaking of fluid.   The following portions of the patient's history were reviewed and updated as appropriate: allergies, current medications, past family history, past medical history, past social history, past surgical history and problem list.   Objective:  There were no vitals filed for this visit.  Fetal Status:           General:  Alert, oriented and cooperative. Patient is in no acute distress.  Skin: Skin is warm and dry. No rash noted.   Cardiovascular: Normal heart rate noted  Respiratory: Normal respiratory effort, no problems with respiration noted  Abdomen: Soft, gravid, appropriate for gestational age.        Pelvic: {Blank single:19197::"Cervical exam performed in the presence of a chaperone","Cervical exam deferred"}        Extremities: Normal range of motion.     Mental Status: Normal mood and affect. Normal behavior. Normal judgment and thought content.   Assessment and Plan:  Pregnancy: [id number] at [redacted]w[redacted]d 1. Supervision of other normal pregnancy, antepartum (Primary)   2. Short interval between pregnancies affecting pregnancy in first trimester, antepartum   3. [redacted] weeks gestation of pregnancy.diagmed  Follow up u/s 2/24 to complete anatomy  4. Elevated hemoglobin A1c A1c 6.2, Early gtt normal. Anticipatory guidance regarding GTT and labs next visit, discussed NPO status after midnight    Preterm labor symptoms and general obstetric precautions including but not limited to vaginal bleeding,  contractions, leaking of fluid and fetal movement were reviewed in detail with the patient. Please refer to After Visit Summary for other counseling recommendations.   No follow-ups on file.  Future Appointments  Date Time Provider Department Center  09/15/2023 10:15 AM Sue Lush, FNP [facility]  09/22/2023  3:30 PM [facility] US5 [facility]    Albertine Grates, FNP

## 2023-09-15 ENCOUNTER — Other Ambulatory Visit: Payer: Self-pay

## 2023-09-15 ENCOUNTER — Ambulatory Visit: Payer: Medicaid Other | Admitting: Obstetrics and Gynecology

## 2023-09-15 VITALS — BP 109/71 | HR 85 | Wt 153.0 lb

## 2023-09-15 DIAGNOSIS — O09891 Supervision of other high risk pregnancies, first trimester: Secondary | ICD-10-CM | POA: Diagnosis not present

## 2023-09-15 DIAGNOSIS — R7309 Other abnormal glucose: Secondary | ICD-10-CM | POA: Diagnosis not present

## 2023-09-15 DIAGNOSIS — Z3480 Encounter for supervision of other normal pregnancy, unspecified trimester: Secondary | ICD-10-CM

## 2023-09-15 DIAGNOSIS — Z3A23 23 weeks gestation of pregnancy: Secondary | ICD-10-CM | POA: Diagnosis not present

## 2023-09-22 ENCOUNTER — Ambulatory Visit: Payer: Medicaid Other

## 2023-10-01 DIAGNOSIS — O09899 Supervision of other high risk pregnancies, unspecified trimester: Secondary | ICD-10-CM | POA: Insufficient documentation

## 2023-10-07 ENCOUNTER — Ambulatory Visit: Payer: Medicaid Other

## 2023-10-07 DIAGNOSIS — O09899 Supervision of other high risk pregnancies, unspecified trimester: Secondary | ICD-10-CM

## 2023-10-09 ENCOUNTER — Other Ambulatory Visit: Payer: Self-pay

## 2023-10-09 DIAGNOSIS — Z3A26 26 weeks gestation of pregnancy: Secondary | ICD-10-CM

## 2023-10-10 ENCOUNTER — Ambulatory Visit: Attending: Obstetrics and Gynecology

## 2023-10-10 ENCOUNTER — Other Ambulatory Visit: Payer: Self-pay | Admitting: *Deleted

## 2023-10-10 ENCOUNTER — Other Ambulatory Visit: Payer: Self-pay | Admitting: Obstetrics and Gynecology

## 2023-10-10 DIAGNOSIS — O09899 Supervision of other high risk pregnancies, unspecified trimester: Secondary | ICD-10-CM | POA: Insufficient documentation

## 2023-10-10 DIAGNOSIS — O09212 Supervision of pregnancy with history of pre-term labor, second trimester: Secondary | ICD-10-CM

## 2023-10-10 DIAGNOSIS — O283 Abnormal ultrasonic finding on antenatal screening of mother: Secondary | ICD-10-CM | POA: Diagnosis not present

## 2023-10-10 DIAGNOSIS — Z3A26 26 weeks gestation of pregnancy: Secondary | ICD-10-CM | POA: Diagnosis not present

## 2023-10-14 ENCOUNTER — Encounter: Payer: Medicaid Other | Admitting: Obstetrics and Gynecology

## 2023-10-14 ENCOUNTER — Other Ambulatory Visit: Payer: Self-pay

## 2023-10-15 NOTE — Progress Notes (Unsigned)
   PRENATAL VISIT NOTE  Subjective:  Sue Lee is a 28 y.o. [phone] at [redacted]w[redacted]d being seen today for ongoing prenatal care.  She is currently monitored for the following issues for this {Blank single:19197::"high-risk","low-risk"} pregnancy and has Vaginal delivery; Supervision of other normal pregnancy, antepartum; Short interval between pregnancies affecting pregnancy in first trimester, antepartum; and History of preterm delivery, currently pregnant on their problem list.  Patient reports {sx:14538}.   .  .   . Denies leaking of fluid.   The following portions of the patient's history were reviewed and updated as appropriate: allergies, current medications, past family history, past medical history, past social history, past surgical history and problem list.   Objective:  There were no vitals filed for this visit.  Fetal Status:           General:  Alert, oriented and cooperative. Patient is in no acute distress.  Skin: Skin is warm and dry. No rash noted.   Cardiovascular: Normal heart rate noted  Respiratory: Normal respiratory effort, no problems with respiration noted  Abdomen: Soft, gravid, appropriate for gestational age.        Pelvic: {Blank single:19197::"Cervical exam performed in the presence of a chaperone","Cervical exam deferred"}        Extremities: Normal range of motion.     Mental Status: Normal mood and affect. Normal behavior. Normal judgment and thought content.   Assessment and Plan:  Pregnancy: [id number] at [redacted]w[redacted]d 1. Supervision of other normal pregnancy, antepartum (Primary) ***  2. [redacted] weeks gestation of pregnancy ***  3. Short interval between pregnancies affecting pregnancy in first trimester, antepartum ***  4. Unwanted fertility ***  5. Grand multipara ***  6. Current pregnancy with history of pre-term labor in first trimester ***  {Blank single:19197::"Term","Preterm"} labor symptoms and general obstetric precautions including but not limited  to vaginal bleeding, contractions, leaking of fluid and fetal movement were reviewed in detail with the patient. Please refer to After Visit Summary for other counseling recommendations.   No follow-ups on file.  Future Appointments  Date Time Provider Department Center  10/16/2023  9:30 AM [facility]  10/16/2023 10:55 AM Leafy Half [facility]  11/11/2023 10:30 AM [facility] US7 [facility]    Richardson Landry, CNM

## 2023-10-16 ENCOUNTER — Ambulatory Visit (INDEPENDENT_AMBULATORY_CARE_PROVIDER_SITE_OTHER): Admitting: Certified Nurse Midwife

## 2023-10-16 ENCOUNTER — Other Ambulatory Visit

## 2023-10-16 ENCOUNTER — Other Ambulatory Visit: Payer: Self-pay

## 2023-10-16 VITALS — BP 108/71 | HR 93 | Wt 158.0 lb

## 2023-10-16 DIAGNOSIS — O09212 Supervision of pregnancy with history of pre-term labor, second trimester: Secondary | ICD-10-CM | POA: Diagnosis not present

## 2023-10-16 DIAGNOSIS — O09211 Supervision of pregnancy with history of pre-term labor, first trimester: Secondary | ICD-10-CM

## 2023-10-16 DIAGNOSIS — Z641 Problems related to multiparity: Secondary | ICD-10-CM

## 2023-10-16 DIAGNOSIS — Z3480 Encounter for supervision of other normal pregnancy, unspecified trimester: Secondary | ICD-10-CM

## 2023-10-16 DIAGNOSIS — Z3009 Encounter for other general counseling and advice on contraception: Secondary | ICD-10-CM | POA: Diagnosis not present

## 2023-10-16 DIAGNOSIS — Z3A26 26 weeks gestation of pregnancy: Secondary | ICD-10-CM

## 2023-10-16 DIAGNOSIS — Z3A27 27 weeks gestation of pregnancy: Secondary | ICD-10-CM | POA: Diagnosis not present

## 2023-10-16 DIAGNOSIS — Z23 Encounter for immunization: Secondary | ICD-10-CM

## 2023-10-16 DIAGNOSIS — O09891 Supervision of other high risk pregnancies, first trimester: Secondary | ICD-10-CM

## 2023-10-17 LAB — GLUCOSE TOLERANCE, 2 HOURS W/ 1HR
Glucose, 1 hour: 145 mg/dL (ref 70–179)
Glucose, 2 hour: 105 mg/dL (ref 70–152)
Glucose, Fasting: 83 mg/dL (ref 70–91)

## 2023-10-22 LAB — CBC
Hematocrit: 33.8 % — ABNORMAL LOW (ref 34.0–46.6)
Hemoglobin: 11 g/dL — ABNORMAL LOW (ref 11.1–15.9)
MCH: 27.2 pg (ref 26.6–33.0)
MCHC: 32.5 g/dL (ref 31.5–35.7)
MCV: 84 fL (ref 79–97)
Platelets: 289 10*3/uL (ref 150–450)
RBC: 4.05 x10E6/uL (ref 3.77–5.28)
RDW: 13.2 % (ref 11.7–15.4)
WBC: 11.1 10*3/uL — ABNORMAL HIGH (ref 3.4–10.8)

## 2023-10-22 LAB — QUANTIFERON-TB GOLD PLUS
QuantiFERON Mitogen Value: 7.78 [IU]/mL
QuantiFERON Nil Value: 0.16 [IU]/mL
QuantiFERON TB1 Ag Value: 0.17 [IU]/mL
QuantiFERON TB2 Ag Value: 0.34 [IU]/mL
QuantiFERON-TB Gold Plus: NEGATIVE

## 2023-10-22 LAB — HIV ANTIBODY (ROUTINE TESTING W REFLEX): HIV Screen 4th Generation wRfx: NONREACTIVE

## 2023-10-22 LAB — RPR: RPR Ser Ql: NONREACTIVE

## 2023-10-24 ENCOUNTER — Encounter: Payer: Self-pay | Admitting: Family Medicine

## 2023-10-27 NOTE — Progress Notes (Unsigned)
   PRENATAL VISIT NOTE  Subjective:  Sue Lee is a 28 y.o. [phone] at [redacted]w[redacted]d being seen today for ongoing prenatal care.  She is currently monitored for the following issues for this {Blank single:19197::"high-risk","low-risk"} pregnancy and has Vaginal delivery; Supervision of other normal pregnancy, antepartum; Short interval between pregnancies affecting pregnancy in first trimester, antepartum; and History of preterm delivery, currently pregnant on their problem list.  Patient reports {sx:14538}.   .  .   . Denies leaking of fluid.   The following portions of the patient's history were reviewed and updated as appropriate: allergies, current medications, past family history, past medical history, past social history, past surgical history and problem list.   Objective:  There were no vitals filed for this visit.  Fetal Status:           General:  Alert, oriented and cooperative. Patient is in no acute distress.  Skin: Skin is warm and dry. No rash noted.   Cardiovascular: Normal heart rate noted  Respiratory: Normal respiratory effort, no problems with respiration noted  Abdomen: Soft, gravid, appropriate for gestational age.        Pelvic: Cervical exam deferred        Extremities: Normal range of motion.     Mental Status: Normal mood and affect. Normal behavior. Normal judgment and thought content.   Assessment and Plan:  Pregnancy: [id number] at [redacted]w[redacted]d  1. Supervision of other normal pregnancy, antepartum (Primary) Patient is doing well, feeling regular fetal movement  BP, FHR, FH appropriate  2. [redacted] weeks gestation of pregnancy Anticipatory guidance about next visits/weeks of pregnancy given.  F/u US scheduled 11/11/23 to r/o FGR given EFW 10% on anatomy scan  3. History of preterm delivery, currently pregnant G4, 36w  Preterm labor symptoms and general obstetric precautions including but not limited to vaginal bleeding, contractions, leaking of fluid and fetal movement  were reviewed in detail with the patient.  Please refer to After Visit Summary for other counseling recommendations.   No follow-ups on file.  Future Appointments  Date Time Provider Department Center  10/30/2023  1:55 PM Christean Leaf [facility]  11/11/2023 10:30 AM [facility] US7 [facility]    Ralene Muskrat, [place]

## 2023-10-30 ENCOUNTER — Other Ambulatory Visit: Payer: Self-pay

## 2023-10-30 ENCOUNTER — Ambulatory Visit (INDEPENDENT_AMBULATORY_CARE_PROVIDER_SITE_OTHER): Admitting: Physician Assistant

## 2023-10-30 VITALS — BP 115/68 | HR 92 | Wt 162.0 lb

## 2023-10-30 DIAGNOSIS — O09893 Supervision of other high risk pregnancies, third trimester: Secondary | ICD-10-CM | POA: Diagnosis not present

## 2023-10-30 DIAGNOSIS — Z3A29 29 weeks gestation of pregnancy: Secondary | ICD-10-CM | POA: Diagnosis not present

## 2023-10-30 DIAGNOSIS — O09899 Supervision of other high risk pregnancies, unspecified trimester: Secondary | ICD-10-CM

## 2023-10-30 DIAGNOSIS — Z23 Encounter for immunization: Secondary | ICD-10-CM

## 2023-10-30 DIAGNOSIS — Z3480 Encounter for supervision of other normal pregnancy, unspecified trimester: Secondary | ICD-10-CM

## 2023-10-30 NOTE — Patient Instructions (Signed)
 You can take up to 3000 mg Tylenol for pain in pregnancy.

## 2023-11-11 ENCOUNTER — Ambulatory Visit

## 2023-11-17 ENCOUNTER — Ambulatory Visit (INDEPENDENT_AMBULATORY_CARE_PROVIDER_SITE_OTHER): Admitting: Advanced Practice Midwife

## 2023-11-17 ENCOUNTER — Other Ambulatory Visit: Payer: Self-pay

## 2023-11-17 VITALS — BP 103/63 | HR 101 | Wt 165.0 lb

## 2023-11-17 DIAGNOSIS — O09893 Supervision of other high risk pregnancies, third trimester: Secondary | ICD-10-CM | POA: Diagnosis not present

## 2023-11-17 DIAGNOSIS — Z3A32 32 weeks gestation of pregnancy: Secondary | ICD-10-CM

## 2023-11-17 DIAGNOSIS — O09899 Supervision of other high risk pregnancies, unspecified trimester: Secondary | ICD-10-CM

## 2023-11-17 DIAGNOSIS — Z3480 Encounter for supervision of other normal pregnancy, unspecified trimester: Secondary | ICD-10-CM

## 2023-11-17 DIAGNOSIS — O09891 Supervision of other high risk pregnancies, first trimester: Secondary | ICD-10-CM

## 2023-11-19 NOTE — Progress Notes (Signed)
   PRENATAL VISIT NOTE  Subjective:  Sue Lee is a 28 y.o. [phone] at [redacted]w[redacted]d being seen today for ongoing prenatal care.  She is currently monitored for the following issues for this high-risk pregnancy and has Supervision of other normal pregnancy, antepartum; Short interval between pregnancies affecting pregnancy in first trimester, antepartum; and History of preterm delivery, currently pregnant on their problem list.   Patient reports no complaints.  Contractions: Irritability. Vag. Bleeding: None.  Movement: Present. Denies leaking of fluid.   The following portions of the patient's history were reviewed and updated as appropriate: allergies, current medications, past family history, past medical history, past social history, past surgical history and problem list.   Objective:   Vitals:   11/17/23 0925  BP: 103/63  Pulse: (!) 101  Weight: 165 lb (74.8 kg)    Fetal Status: Fetal Heart Rate (bpm): 133   Movement: Present     General:  Alert, oriented and cooperative. Patient is in no acute distress.  Skin: Skin is warm and dry. No rash noted.   Cardiovascular: Normal heart rate noted  Respiratory: Normal respiratory effort, no problems with respiration noted  Abdomen: Soft, gravid, appropriate for gestational age.  Pain/Pressure: Absent     Pelvic: Cervical exam deferred        Extremities: Normal range of motion.  Edema: None  Mental Status: Normal mood and affect. Normal behavior. Normal judgment and thought content.   EFW 10%tile   Assessment and Plan:  Pregnancy: [id number] at [redacted]w[redacted]d 1. Supervision of other normal pregnancy, antepartum (Primary)  2. Short interval between pregnancies affecting pregnancy in first trimester, antepartum  3. History of preterm delivery, currently pregnant - PTL precautions.  4. [redacted] weeks gestation of pregnancy  5. Borderline FGR - Growth US  3 weeks   Preterm labor symptoms and general obstetric precautions including but not limited to  vaginal bleeding, contractions, leaking of fluid and fetal movement were reviewed in detail with the patient. Please refer to After Visit Summary for other counseling recommendations.   No follow-ups on file.  Future Appointments  Date Time Provider Department Center  11/24/2023  2:35 PM Abigail Abler, MD [facility]  12/01/2023 11:00 AM [facility] PROVIDER 1 [facility]  12/01/2023 11:30 AM [facility] US6 [facility]    Krystal Delduca  [facility] for [employer]

## 2023-11-24 ENCOUNTER — Other Ambulatory Visit: Payer: Self-pay

## 2023-11-24 ENCOUNTER — Ambulatory Visit: Admitting: Obstetrics and Gynecology

## 2023-11-24 VITALS — BP 106/64 | HR 91 | Wt 162.0 lb

## 2023-11-24 DIAGNOSIS — Z3480 Encounter for supervision of other normal pregnancy, unspecified trimester: Secondary | ICD-10-CM

## 2023-11-24 DIAGNOSIS — M549 Dorsalgia, unspecified: Secondary | ICD-10-CM | POA: Diagnosis not present

## 2023-11-24 DIAGNOSIS — O365930 Maternal care for other known or suspected poor fetal growth, third trimester, not applicable or unspecified: Secondary | ICD-10-CM | POA: Insufficient documentation

## 2023-11-24 DIAGNOSIS — O99891 Other specified diseases and conditions complicating pregnancy: Secondary | ICD-10-CM | POA: Diagnosis not present

## 2023-11-24 DIAGNOSIS — O09893 Supervision of other high risk pregnancies, third trimester: Secondary | ICD-10-CM | POA: Diagnosis not present

## 2023-11-24 DIAGNOSIS — Z3A33 33 weeks gestation of pregnancy: Secondary | ICD-10-CM

## 2023-11-24 DIAGNOSIS — O09899 Supervision of other high risk pregnancies, unspecified trimester: Secondary | ICD-10-CM

## 2023-11-24 MED ORDER — CYCLOBENZAPRINE HCL 10 MG PO TABS: 10.0000 mg | ORAL_TABLET | Freq: Three times a day (TID) | ORAL | 2 refills | Status: AC | PRN

## 2023-11-24 NOTE — Progress Notes (Signed)
   PRENATAL VISIT NOTE  Subjective:  Sue Lee is a 28 y.o. [phone] at [redacted]w[redacted]d being seen today for ongoing prenatal care.  She is currently monitored for the following issues for this low-risk pregnancy and has Supervision of other normal pregnancy, antepartum; Short interval between pregnancies affecting pregnancy in third trimester, antepartum; History of preterm delivery, currently pregnant; and SGA (small for gestational age) on their problem list.  Patient doing well with no acute concerns today. She reports  mild cramping and back pain. .  Contractions: Irritability. Vag. Bleeding: None.  Movement: Present. Pt decided to participate in go karting with her children and she was jostled a few times.  Denies leaking of fluid, but did notice ? Loss of mucus plug..   The following portions of the patient's history were reviewed and updated as appropriate: allergies, current medications, past family history, past medical history, past social history, past surgical history and problem list. Problem list updated.  Objective:   Vitals:   11/24/23 1443  BP: 106/64  Pulse: 91  Weight: 162 lb (73.5 kg)    Fetal Status: Fetal Heart Rate (bpm): 136 Fundal Height: 33 cm Movement: Present     General:  Alert, oriented and cooperative. Patient is in no acute distress.  Skin: Skin is warm and dry. No rash noted.   Cardiovascular: Normal heart rate noted  Respiratory: Normal respiratory effort, no problems with respiration noted  Abdomen: Soft, gravid, appropriate for gestational age.  Pain/Pressure: Present     Pelvic: Cervical exam deferred        Extremities: Normal range of motion.  Edema: None  Mental Status:  Normal mood and affect. Normal behavior. Normal judgment and thought content.   Assessment and Plan:  Pregnancy: [id number] at [redacted]w[redacted]d  1. [redacted] weeks gestation of pregnancy (Primary)   2. Supervision of other normal pregnancy, antepartum Continue routine prenatal care  3. History of  preterm delivery, currently pregnant No s/sx of preterm labor  4. SGA (small for gestational age) Last EFW was at 10%, growth scan pending in 1 weeks  5. Back pain during pregnancy Rx for flexeril given - cyclobenzaprine (FLEXERIL) 10 MG tablet; Take 1 tablet (10 mg total) by mouth 3 (three) times daily as needed for muscle spasms.  Dispense: 30 tablet; Refill: 2  Preterm labor symptoms and general obstetric precautions including but not limited to vaginal bleeding, contractions, leaking of fluid and fetal movement were reviewed in detail with the patient.  Please refer to After Visit Summary for other counseling recommendations.   Return in about 2 weeks (around 12/08/2023) for ROB, in person.   Avie Boeck, MD Faculty Attending Center for [facility]

## 2023-12-01 ENCOUNTER — Ambulatory Visit

## 2023-12-02 ENCOUNTER — Ambulatory Visit

## 2023-12-02 ENCOUNTER — Ambulatory Visit: Attending: Obstetrics and Gynecology | Admitting: Maternal & Fetal Medicine

## 2023-12-02 ENCOUNTER — Other Ambulatory Visit: Payer: Self-pay | Admitting: *Deleted

## 2023-12-02 VITALS — BP 115/61 | HR 102

## 2023-12-02 DIAGNOSIS — O09213 Supervision of pregnancy with history of pre-term labor, third trimester: Secondary | ICD-10-CM | POA: Diagnosis not present

## 2023-12-02 DIAGNOSIS — Z3A34 34 weeks gestation of pregnancy: Secondary | ICD-10-CM | POA: Insufficient documentation

## 2023-12-02 DIAGNOSIS — O365930 Maternal care for other known or suspected poor fetal growth, third trimester, not applicable or unspecified: Secondary | ICD-10-CM

## 2023-12-02 DIAGNOSIS — Z3480 Encounter for supervision of other normal pregnancy, unspecified trimester: Secondary | ICD-10-CM

## 2023-12-02 DIAGNOSIS — O09893 Supervision of other high risk pregnancies, third trimester: Secondary | ICD-10-CM | POA: Diagnosis not present

## 2023-12-02 DIAGNOSIS — O09899 Supervision of other high risk pregnancies, unspecified trimester: Secondary | ICD-10-CM

## 2023-12-02 NOTE — Progress Notes (Signed)
   Patient information  Patient Name: Sue Lee  Patient MRN:   [id number]  Referring practice: MFM Referring Provider: [facility])  MFM CONSULT  Sue Lee is a 28 y.o. [phone] at [redacted]w[redacted]d here for ultrasound and consultation. Patient Active Problem List   Diagnosis Date Noted   SGA (small for gestational age) 11/24/2023   History of preterm delivery, currently pregnant 10/01/2023   Short interval between pregnancies affecting pregnancy in third trimester, antepartum 06/19/2023   Supervision of other normal pregnancy, antepartum 06/17/2023    Sue Lee is doing well today with no acute concerns.  RE poor fetal growth : Prior US  showed EFW at th 10% and is stable at 13%. Growth is driven mostly by the [facility] and [place]. This likely represents constitutional growth.    Sonographic findings Single intrauterine pregnancy at 34w 3d.  Fetal cardiac activity:  Observed and appears normal. Presentation: Cephalic. Interval fetal anatomy appears normal. Fetal biometry shows the estimated fetal weight at the 13 percentile. Amniotic fluid volume: Within normal limits. MVP: 4.32 cm. Placenta: Posterior.  There are limitations of prenatal ultrasound such as the inability to detect certain abnormalities due to poor visualization. Various factors such as fetal position, gestational age and maternal body habitus may increase the difficulty in visualizing the fetal anatomy.    Recommendations - F/u growth US  in 4 weeks.  Review of Systems: A review of systems was performed and was negative except per HPI   Vitals and Physical Exam    12/02/2023    8:14 AM 11/24/2023    2:43 PM 11/17/2023    9:25 AM  Vitals with BMI  Weight  162 lbs 165 lbs  Systolic 115 106 147  Diastolic 61 64 63  Pulse 102 91 101    Sitting comfortably on the sonogram table Nonlabored breathing Normal rate and rhythm Abdomen is nontender  Past pregnancies OB History  Gravida Para Term  Preterm AB Living  5 3 2 1 1 3   SAB IAB Ectopic Multiple Live Births  1 0 0 0 3    # Outcome Date GA Lbr Len/2nd Weight Sex Type Anes PTL Lv  5 Current           4 Preterm 01/25/23 [redacted]w[redacted]d 10:42 / 00:10 5 lb 13.1 oz (2.64 kg) F Vag-Spont EPI  LIV     Birth Comments: WDL  3 SAB 2020          2 Term 02/29/16    M Vag-Spont   LIV  1 Term 02/13/14    M Vag-Spont   LIV     I spent 10 minutes reviewing the patients chart, including labs and images as well as counseling the patient about her medical conditions. Greater than 50% of the time was spent in direct face-to-face patient counseling.  Penney Bowling  MFM, [facility]   12/02/2023  8:48 AM

## 2023-12-10 ENCOUNTER — Encounter: Admitting: Obstetrics and Gynecology

## 2023-12-11 ENCOUNTER — Encounter: Admitting: Family Medicine

## 2023-12-27 IMAGING — US US PELVIS COMPLETE WITH TRANSVAGINAL
1 series · 13 of 25 positions shown · non-contrast
Comparison: None

CLINICAL DATA: Pelvic pain since last menses.

EXAM:
TRANSABDOMINAL AND TRANSVAGINAL ULTRASOUND OF PELVIS
TECHNIQUE: Both transabdominal and transvaginal ultrasound examinations of the
pelvis were performed. Transabdominal technique was performed for
global imaging of the pelvis including uterus, ovaries, adnexal
regions, and pelvic cul-de-sac. It was necessary to proceed with
endovaginal exam following the transabdominal exam to visualize the
ovaries, adnexa and endometrium.

[Series 1: us pelvic complete with transvaginal · 131 acquisitions, 13 frames shown]
[im 1/131]
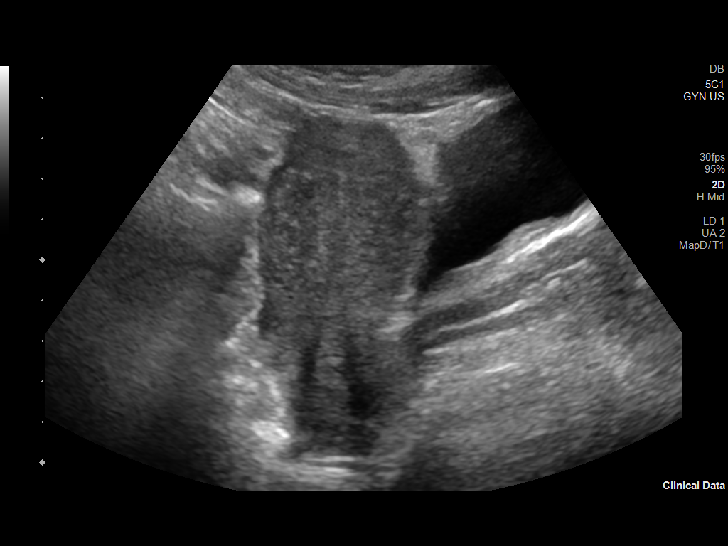
[im 11/131]
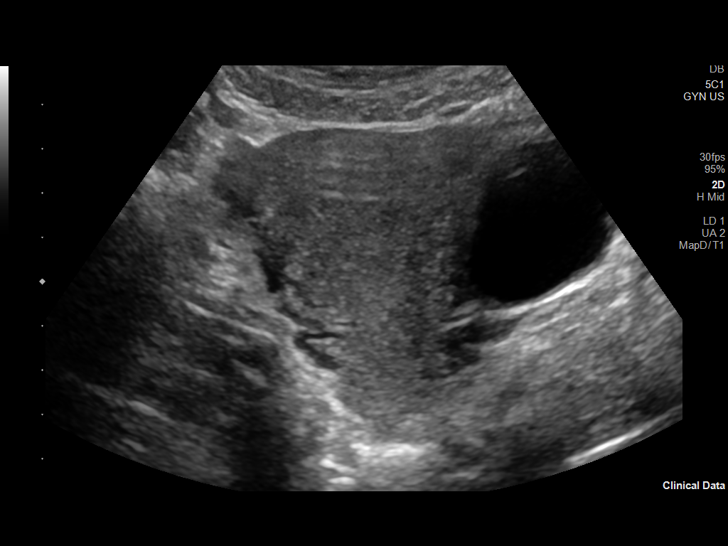
[im 22/131]
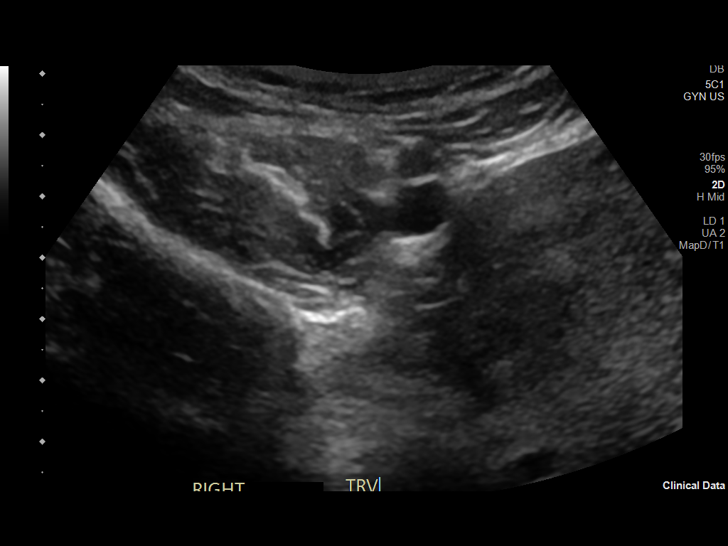
[im 33/131]
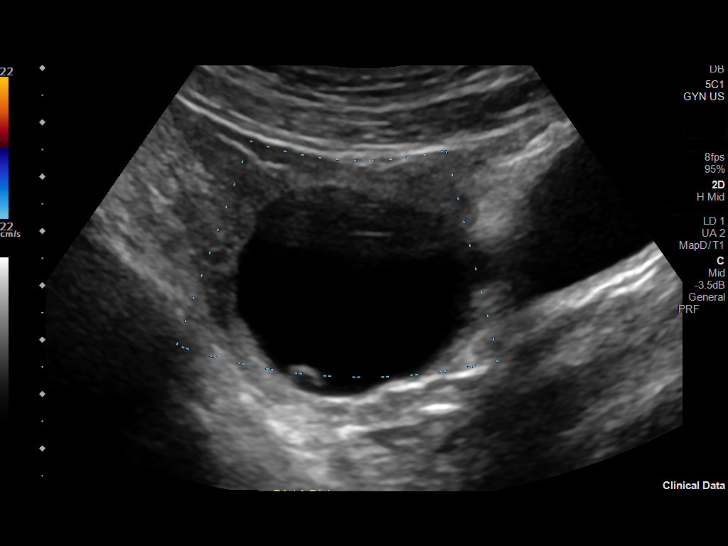
[im 44/131]
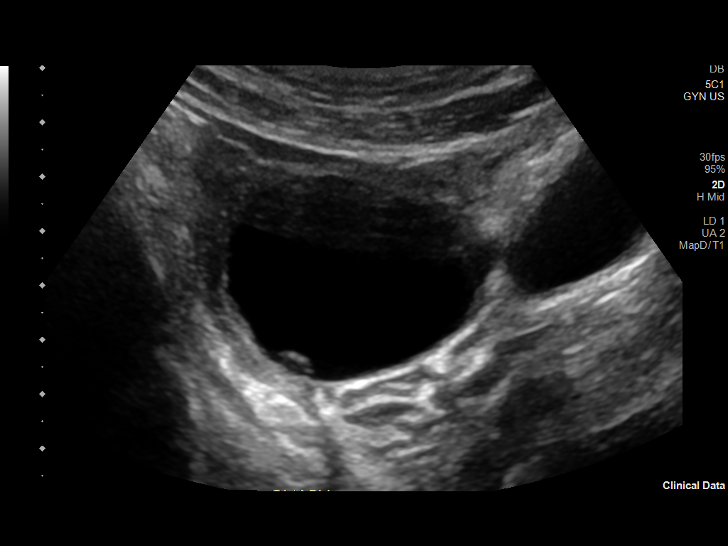
[im 55/131]
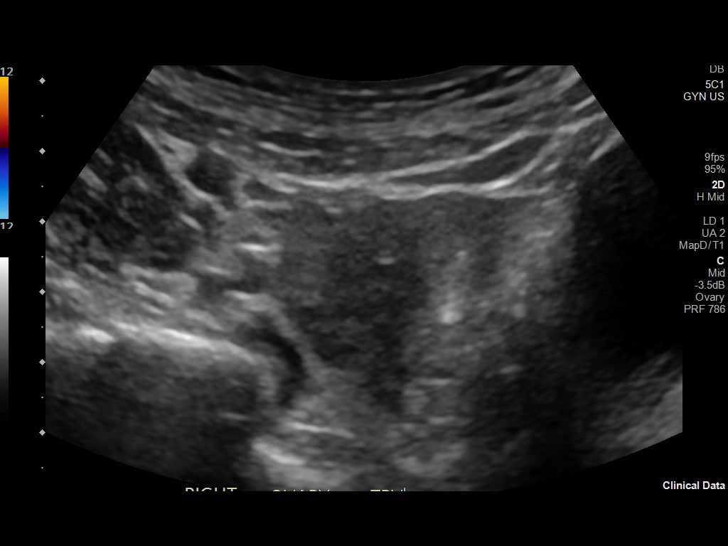
[im 66/131]
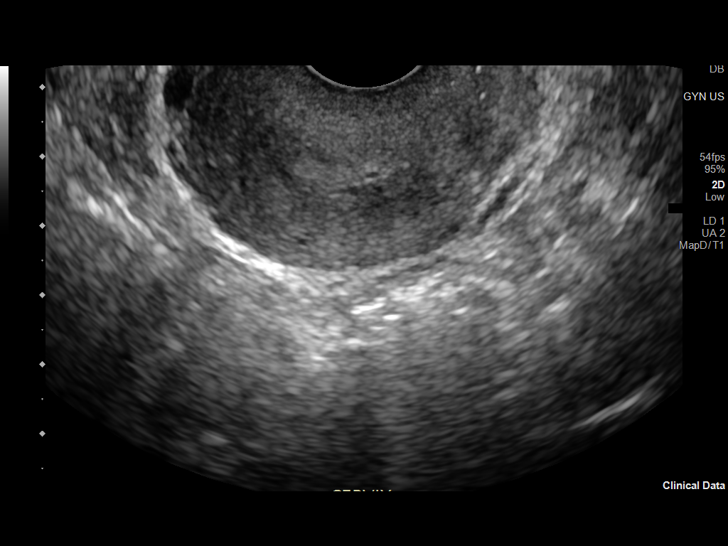
[im 76/131]
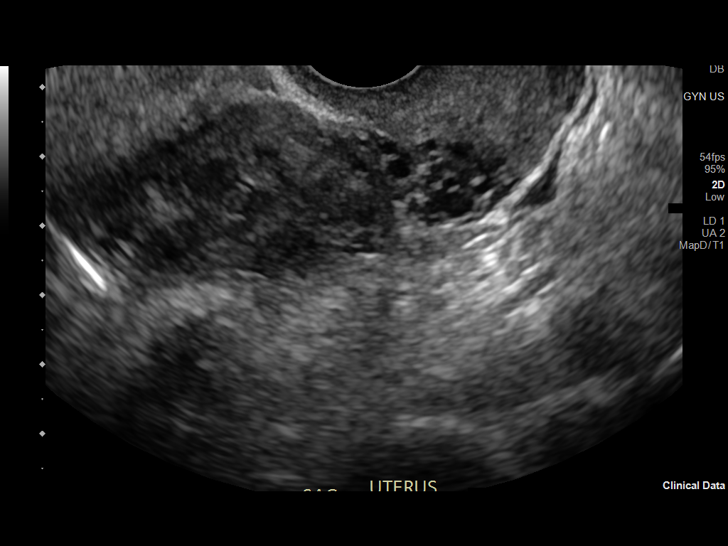
[im 87/131]
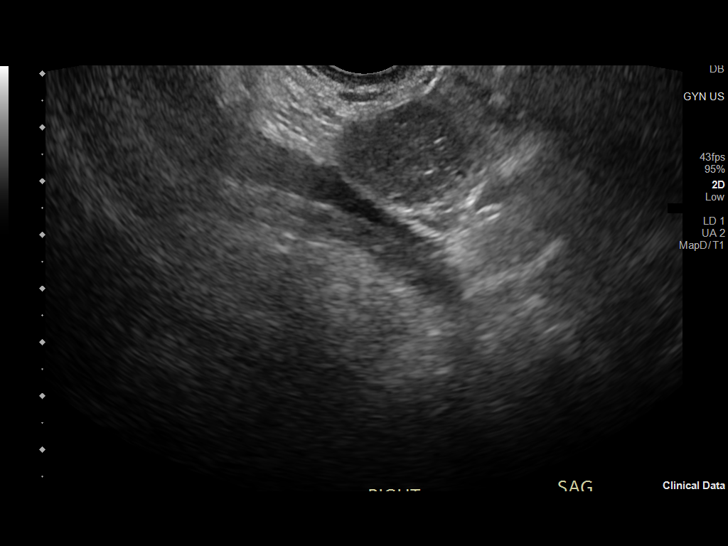
[im 98/131]
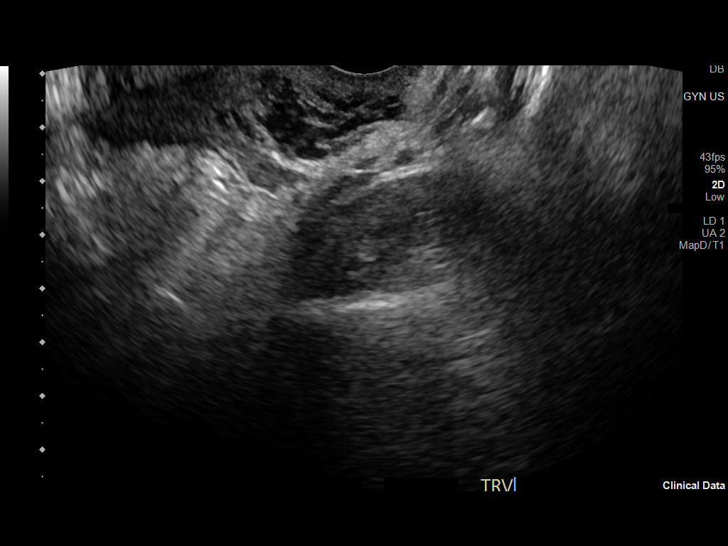
[im 109/131]
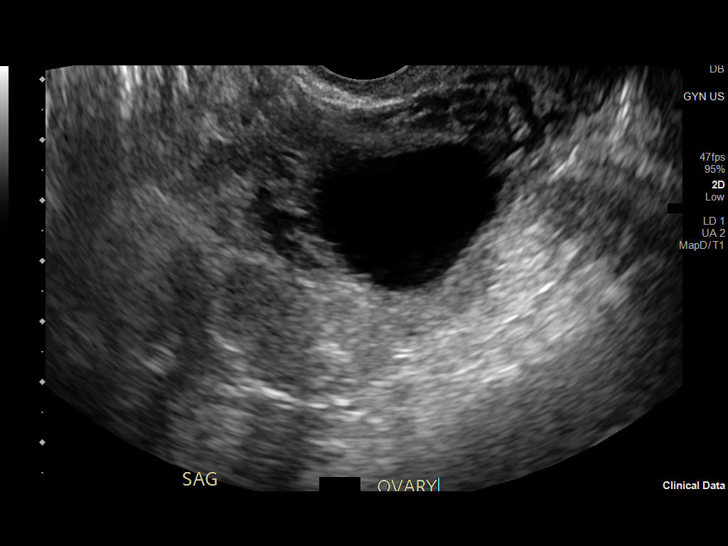
[im 120/131]
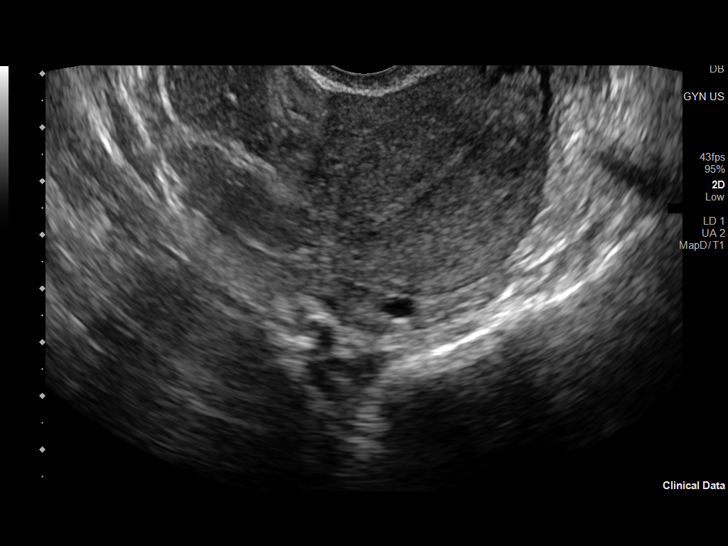
[im 131/131]
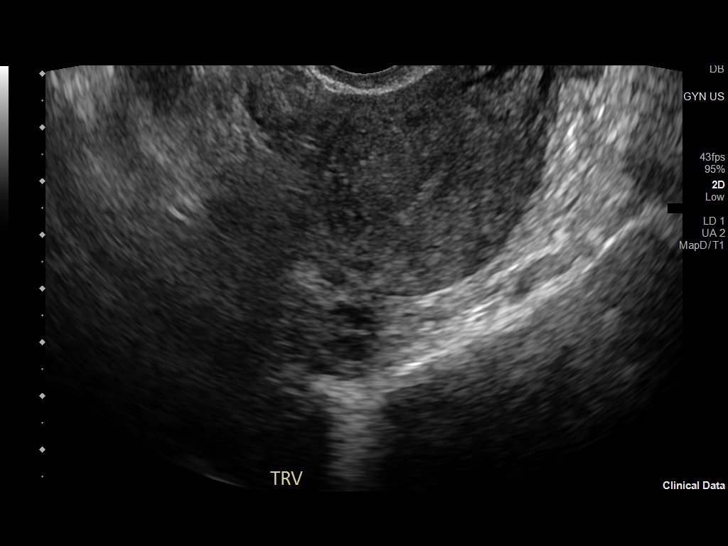

[13 of 25 positions shown; findings below may reference images not displayed]

FINDINGS: Uterus

Measurements: 8.1 x 4.2 x 5.2 cm = volume: 93 mL. No fibroids or
other mass visualized.

Endometrium

Thickness: 3 mm.  No focal abnormality visualized.

Right ovary

Measurements: 3.5 x 1.4 x 2.2 cm = volume: 5.7 mL. Normal
appearance/no adnexal mass.

Left ovary

Measurements: 5.8 x 3.1 x 3.7 cm = volume: 35 mL. The majority of
the ovary is a encompassed by a cyst which spans 4.4 x 3.3 x 3.6 cm;
it has several peripherally located rounded septations.

Spectral Doppler evaluation was not performed.

Other findings

No abnormal free fluid.
IMPRESSION: 1. There is a complicated LEFT ovarian cyst which measures 4.4 cm
with multiple peripheral located septations versus follicles.
Asymmetric size of the ovary places the patient at increased risk
for ovarian torsion. If there is clinical concern for acute,
previous or intermittent ovarian torsion, then recommend gynecologic
consultation. Otherwise, recommend follow-up ultrasound in 6 weeks
to assess for resolution.

## 2024-01-01 ENCOUNTER — Inpatient Hospital Stay (HOSPITAL_COMMUNITY): Admitting: Anesthesiology

## 2024-01-01 ENCOUNTER — Other Ambulatory Visit: Payer: Self-pay

## 2024-01-01 ENCOUNTER — Encounter (HOSPITAL_COMMUNITY): Payer: Self-pay | Admitting: Obstetrics and Gynecology

## 2024-01-01 ENCOUNTER — Inpatient Hospital Stay (HOSPITAL_COMMUNITY)
Admission: AD | Admit: 2024-01-01 | Discharge: 2024-01-02 | DRG: 807 | Disposition: A | Discharge status: Home / Self Care | Attending: Obstetrics and Gynecology | Admitting: Obstetrics and Gynecology

## 2024-01-01 DIAGNOSIS — O365930 Maternal care for other known or suspected poor fetal growth, third trimester, not applicable or unspecified: Secondary | ICD-10-CM | POA: Diagnosis present

## 2024-01-01 DIAGNOSIS — Z3A38 38 weeks gestation of pregnancy: Secondary | ICD-10-CM

## 2024-01-01 DIAGNOSIS — O4292 Full-term premature rupture of membranes, unspecified as to length of time between rupture and onset of labor: Secondary | ICD-10-CM | POA: Diagnosis present

## 2024-01-01 DIAGNOSIS — O99334 Smoking (tobacco) complicating childbirth: Secondary | ICD-10-CM | POA: Diagnosis present

## 2024-01-01 DIAGNOSIS — Z8249 Family history of ischemic heart disease and other diseases of the circulatory system: Secondary | ICD-10-CM

## 2024-01-01 DIAGNOSIS — O09899 Supervision of other high risk pregnancies, unspecified trimester: Secondary | ICD-10-CM

## 2024-01-01 DIAGNOSIS — Z2839 Other underimmunization status: Secondary | ICD-10-CM

## 2024-01-01 DIAGNOSIS — Z30017 Encounter for initial prescription of implantable subdermal contraceptive: Secondary | ICD-10-CM | POA: Diagnosis not present

## 2024-01-01 DIAGNOSIS — F17210 Nicotine dependence, cigarettes, uncomplicated: Secondary | ICD-10-CM | POA: Diagnosis present

## 2024-01-01 DIAGNOSIS — O26893 Other specified pregnancy related conditions, third trimester: Secondary | ICD-10-CM | POA: Diagnosis present

## 2024-01-01 DIAGNOSIS — Z833 Family history of diabetes mellitus: Secondary | ICD-10-CM | POA: Diagnosis not present

## 2024-01-01 DIAGNOSIS — O09893 Supervision of other high risk pregnancies, third trimester: Secondary | ICD-10-CM

## 2024-01-01 LAB — CBC
HCT: 33.1 % — ABNORMAL LOW (ref 36.0–46.0)
Hemoglobin: 10.7 g/dL — ABNORMAL LOW (ref 12.0–15.0)
MCH: 26.5 pg (ref 26.0–34.0)
MCHC: 32.3 g/dL (ref 30.0–36.0)
MCV: 81.9 fL (ref 80.0–100.0)
Platelets: 259 10*3/uL (ref 150–400)
RBC: 4.04 MIL/uL (ref 3.87–5.11)
RDW: 19.1 % — ABNORMAL HIGH (ref 11.5–15.5)
WBC: 6.7 10*3/uL (ref 4.0–10.5)
nRBC: 0 % (ref 0.0–0.2)

## 2024-01-01 LAB — RPR: RPR Ser Ql: NONREACTIVE

## 2024-01-01 LAB — TYPE AND SCREEN
ABO/RH(D): A POS
Antibody Screen: NEGATIVE

## 2024-01-01 LAB — GROUP B STREP BY PCR: Group B strep by PCR: NEGATIVE

## 2024-01-01 MED ORDER — PRENATAL MULTIVITAMIN CH
1.0000 | ORAL_TABLET | Freq: Every day | ORAL | Status: DC
  Administered 2024-01-02: 1 via ORAL
  Filled 2024-01-01: qty 1

## 2024-01-01 MED ORDER — SIMETHICONE 80 MG PO CHEW: 80.0000 mg | CHEWABLE_TABLET | ORAL | Status: DC | PRN

## 2024-01-01 MED ORDER — WITCH HAZEL-GLYCERIN EX PADS: 1.0000 | MEDICATED_PAD | CUTANEOUS | Status: DC | PRN

## 2024-01-01 MED ORDER — IBUPROFEN 600 MG PO TABS
600.0000 mg | ORAL_TABLET | Freq: Four times a day (QID) | ORAL | Status: DC
  Administered 2024-01-01 – 2024-01-02 (×4): 600 mg via ORAL
  Filled 2024-01-01 (×4): qty 1

## 2024-01-01 MED ORDER — FENTANYL CITRATE (PF) 100 MCG/2ML IJ SOLN: 100.0000 ug | INTRAMUSCULAR | Status: DC | PRN

## 2024-01-01 MED ORDER — LIDOCAINE HCL (PF) 1 % IJ SOLN: 30.0000 mL | INTRAMUSCULAR | Status: DC | PRN

## 2024-01-01 MED ORDER — OXYTOCIN BOLUS FROM INFUSION
333.0000 mL | Freq: Once | INTRAVENOUS | Status: AC
  Administered 2024-01-01: 333 mL via INTRAVENOUS

## 2024-01-01 MED ORDER — OXYCODONE-ACETAMINOPHEN 5-325 MG PO TABS: 1.0000 | ORAL_TABLET | ORAL | Status: DC | PRN

## 2024-01-01 MED ORDER — COCONUT OIL OIL: 1.0000 | TOPICAL_OIL | Status: DC | PRN

## 2024-01-01 MED ORDER — SOD CITRATE-CITRIC ACID 500-334 MG/5ML PO SOLN: 30.0000 mL | ORAL | Status: DC | PRN

## 2024-01-01 MED ORDER — FENTANYL-BUPIVACAINE-NACL 0.5-0.125-0.9 MG/250ML-% EP SOLN
12.0000 mL/h | EPIDURAL | Status: DC | PRN
  Administered 2024-01-01: 12 mL/h via EPIDURAL
  Filled 2024-01-01: qty 250

## 2024-01-01 MED ORDER — LACTATED RINGERS IV SOLN: INTRAVENOUS | Status: DC

## 2024-01-01 MED ORDER — ACETAMINOPHEN 325 MG PO TABS
650.0000 mg | ORAL_TABLET | ORAL | Status: DC | PRN
  Administered 2024-01-01: 650 mg via ORAL
  Filled 2024-01-01: qty 2

## 2024-01-01 MED ORDER — ONDANSETRON HCL 4 MG PO TABS: 4.0000 mg | ORAL_TABLET | ORAL | Status: DC | PRN

## 2024-01-01 MED ORDER — PHENYLEPHRINE 80 MCG/ML (10ML) SYRINGE FOR IV PUSH (FOR BLOOD PRESSURE SUPPORT): 80.0000 ug | PREFILLED_SYRINGE | INTRAVENOUS | Status: DC | PRN

## 2024-01-01 MED ORDER — OXYCODONE HCL 5 MG PO TABS: 10.0000 mg | ORAL_TABLET | ORAL | Status: DC | PRN

## 2024-01-01 MED ORDER — OXYTOCIN-SODIUM CHLORIDE 30-0.9 UT/500ML-% IV SOLN
2.5000 [IU]/h | INTRAVENOUS | Status: DC
  Administered 2024-01-01: 2.5 [IU]/h via INTRAVENOUS
  Filled 2024-01-01: qty 500

## 2024-01-01 MED ORDER — OXYCODONE HCL 5 MG PO TABS: 5.0000 mg | ORAL_TABLET | ORAL | Status: DC | PRN

## 2024-01-01 MED ORDER — DIPHENHYDRAMINE HCL 25 MG PO CAPS: 25.0000 mg | ORAL_CAPSULE | Freq: Four times a day (QID) | ORAL | Status: DC | PRN

## 2024-01-01 MED ORDER — DIBUCAINE (PERIANAL) 1 % EX OINT
1.0000 | TOPICAL_OINTMENT | CUTANEOUS | Status: DC | PRN
Start: 2024-01-01 — End: 2024-01-02

## 2024-01-01 MED ORDER — DIPHENHYDRAMINE HCL 50 MG/ML IJ SOLN: 12.5000 mg | INTRAMUSCULAR | Status: DC | PRN

## 2024-01-01 MED ORDER — LACTATED RINGERS IV SOLN: 500.0000 mL | Freq: Once | INTRAVENOUS | Status: DC

## 2024-01-01 MED ORDER — ONDANSETRON HCL 4 MG/2ML IJ SOLN: 4.0000 mg | INTRAMUSCULAR | Status: DC | PRN

## 2024-01-01 MED ORDER — PHENYLEPHRINE 80 MCG/ML (10ML) SYRINGE FOR IV PUSH (FOR BLOOD PRESSURE SUPPORT)
80.0000 ug | PREFILLED_SYRINGE | INTRAVENOUS | Status: DC | PRN
  Filled 2024-01-01: qty 10

## 2024-01-01 MED ORDER — EPHEDRINE 5 MG/ML INJ: 10.0000 mg | INTRAVENOUS | Status: DC | PRN

## 2024-01-01 MED ORDER — OXYCODONE-ACETAMINOPHEN 5-325 MG PO TABS: 2.0000 | ORAL_TABLET | ORAL | Status: DC | PRN

## 2024-01-01 MED ORDER — MEASLES, MUMPS & RUBELLA VAC IJ SOLR: 0.5000 mL | Freq: Once | INTRAMUSCULAR | Status: DC

## 2024-01-01 MED ORDER — LACTATED RINGERS IV SOLN
500.0000 mL | INTRAVENOUS | Status: DC | PRN
  Administered 2024-01-01: 500 mL via INTRAVENOUS

## 2024-01-01 MED ORDER — BENZOCAINE-MENTHOL 20-0.5 % EX AERO
1.0000 | INHALATION_SPRAY | CUTANEOUS | Status: DC | PRN
  Filled 2024-01-01: qty 56

## 2024-01-01 MED ORDER — SENNOSIDES-DOCUSATE SODIUM 8.6-50 MG PO TABS
2.0000 | ORAL_TABLET | Freq: Every day | ORAL | Status: DC
  Administered 2024-01-02: 2 via ORAL
  Filled 2024-01-01: qty 2

## 2024-01-01 MED ORDER — ONDANSETRON HCL 4 MG/2ML IJ SOLN
4.0000 mg | Freq: Four times a day (QID) | INTRAMUSCULAR | Status: DC | PRN
  Administered 2024-01-01: 4 mg via INTRAVENOUS
  Filled 2024-01-01: qty 2

## 2024-01-01 MED ORDER — ZOLPIDEM TARTRATE 5 MG PO TABS: 5.0000 mg | ORAL_TABLET | Freq: Every evening | ORAL | Status: DC | PRN

## 2024-01-01 MED ORDER — LIDOCAINE HCL (PF) 1 % IJ SOLN
INTRAMUSCULAR | Status: DC | PRN
  Administered 2024-01-01: 11 mL via EPIDURAL

## 2024-01-01 NOTE — Discharge Summary (Signed)
 Postpartum Discharge Summary  Date of Service updated***     Patient Name: Sue Lee DOB: [birth year] MRN: [id number]  Date of admission: 01/01/2024 Delivery date:01/01/2024 Delivering provider: Keymani Mclean M Date of discharge: 01/01/2024  Admitting diagnosis: Labor and delivery, indication for care [O75.9] Intrauterine pregnancy: [redacted]w[redacted]d     Secondary diagnosis:  Principal Problem:   Labor and delivery, indication for care Active Problems:   Rubella non-immune status, antepartum   Short interval between pregnancies affecting pregnancy in third trimester, antepartum   History of preterm delivery, currently pregnant  Additional problems:  Patient Active Problem List   Diagnosis Date Noted   Labor and delivery, indication for care 01/01/2024   Poor fetal growth affecting management of mother in third trimester 11/24/2023   History of preterm delivery, currently pregnant 10/01/2023   Short interval between pregnancies affecting pregnancy in third trimester, antepartum 06/19/2023   Supervision of other normal pregnancy, antepartum 06/17/2023   Rubella non-immune status, antepartum 09/13/2022       Discharge diagnosis: {DX.:23714}                                              Post partum procedures:{Postpartum procedures:23558} Augmentation: N/A Complications: None  Hospital course: Onset of Labor With Vaginal Delivery      28 y.o. yo [id number] at [redacted]w[redacted]d was admitted in Active Labor on 01/01/2024. Labor course was uncomplicated  Membrane Rupture Time/Date: 8:45 AM,01/01/2024  Delivery Method:Vaginal, Spontaneous Operative Delivery:N/A Episiotomy: None Lacerations:  1st degree;Labial Patient had a postpartum course complicated by ***.  She is ambulating, tolerating a regular diet, passing flatus, and urinating well. Patient is discharged home in stable condition on 01/01/24.  Newborn Data: Birth date:01/01/2024 Birth time:9:30 AM Gender:Female Living status:Living Apgars:9  ,9  Weight:   Magnesium Sulfate received: No BMZ received: No Rhophylac:N/A MMR:Yes, non immune plan for MMR prior to DC  T-DaP:Given prenatally Flu: Yes RSV Vaccine received: No Transfusion:{Transfusion received:30440034}  Immunizations received: Immunization History  Administered Date(s) Administered   Influenza, Seasonal, Injecte, Preservative Fre 10/16/2023   MMR 01/26/2023   Tdap 05/19/2018, 10/30/2023    Physical exam  Vitals:   01/01/24 0900 01/01/24 0945 01/01/24 1000 01/01/24 1015  BP: 110/63 (!) 104/43 (!) 98/50 (!) 102/59  Pulse: 72 73 69 71  Resp:      Temp:      TempSrc:      SpO2:      Weight:      Height:       General: {Exam; general:21111117} Lochia: {Desc; appropriate/inappropriate:30686::"appropriate"} Uterine Fundus: {Desc; firm/soft:30687} Incision: {Exam; incision:21111123} DVT Evaluation: {Exam; dvt:2111122} Labs: Lab Results  Component Value Date   WBC 6.7 01/01/2024   HGB 10.7 (L) 01/01/2024   HCT 33.1 (L) 01/01/2024   MCV 81.9 01/01/2024   PLT 259 01/01/2024      Latest Ref Rng & Units 03/23/2023   12:48 AM  CMP  Glucose 70 - 99 mg/dL 387   BUN 6 - 20 mg/dL 6   Creatinine 5.64 - 3.32 mg/dL 9.51   Sodium 884 - 166 mmol/L 136   Potassium 3.5 - 5.1 mmol/L 4.0   Chloride 98 - 111 mmol/L 104   CO2 22 - 32 mmol/L 21   Calcium 8.9 - 10.3 mg/dL 8.9   Total Protein 6.5 - 8.1 g/dL 7.5   Total Bilirubin 0.3 - 1.2  mg/dL 0.3   Alkaline Phos 38 - 126 U/L 52   AST 15 - 41 U/L 17   ALT 0 - 44 U/L 11    Edinburgh Score:    03/14/2023   10:44 AM  Edinburgh Postnatal Depression Scale Screening Tool  I have been able to laugh and see the funny side of things. 0  I have looked forward with enjoyment to things. 0  I have blamed myself unnecessarily when things went wrong. 0  I have been anxious or worried for no good reason. 0  I have felt scared or panicky for no good reason. 0  Things have been getting on top of me. 0  I have been so  unhappy that I have had difficulty sleeping. 0  I have felt sad or miserable. 0  I have been so unhappy that I have been crying. 0  The thought of harming myself has occurred to me. 0  Edinburgh Postnatal Depression Scale Total 0   No data recorded  After visit meds:  Allergies as of 01/01/2024   No Known Allergies   Med Rec must be completed prior to using this [facility]***        Discharge home in stable condition Infant Feeding: {Baby feeding:23562} Infant Disposition:{CHL IP OB HOME WITH MVHQIO:96295} Discharge instruction: per After Visit Summary and Postpartum booklet. Activity: Advance as tolerated. Pelvic rest for 6 weeks.  Diet: {OB MWUX:32440102} Future Appointments: Future Appointments  Date Time Provider Department Center  01/02/2024 10:15 AM Denzil Flatten, NP [facility]  01/08/2024 10:15 AM Zelma Hidden, FNP [facility]   Follow up Visit:   Please schedule this patient for a Virtual postpartum visit in 4 weeks with the following provider: Any provider. Additional Postpartum F/U:N/A   Low risk pregnancy complicated by: N/A  Delivery mode:  Vaginal, Spontaneous Anticipated Birth Control:  Nexplanon   01/01/2024 Corie Diamond, CNM

## 2024-01-01 NOTE — Anesthesia Preprocedure Evaluation (Signed)
 Anesthesia Evaluation  Patient identified by MRN, date of birth, ID band Patient awake    Reviewed: Allergy & Precautions, Patient's Chart, lab work & pertinent test results  History of Anesthesia Complications Negative for: history of anesthetic complications  Airway Mallampati: II  TM Distance: >3 FB Neck ROM: Full    Dental no notable dental hx.    Pulmonary Current Smoker, former smoker   Pulmonary exam normal        Cardiovascular negative cardio ROS Normal cardiovascular exam     Neuro/Psych negative neurological ROS  negative psych ROS   GI/Hepatic negative GI ROS, Neg liver ROS,,,  Endo/Other  negative endocrine ROS    Renal/GU negative Renal ROS  negative genitourinary   Musculoskeletal negative musculoskeletal ROS (+)    Abdominal   Peds  Hematology negative hematology ROS (+)   Anesthesia Other Findings Day of surgery medications reviewed with patient.  Reproductive/Obstetrics (+) Pregnancy                              Anesthesia Physical Anesthesia Plan  ASA: 2  Anesthesia Plan: Epidural   Post-op Pain Management:    Induction:   PONV Risk Score and Plan: Treatment may vary due to age or medical condition  Airway Management Planned: Natural Airway  Additional Equipment: Fetal Monitoring  Intra-op Plan:   Post-operative Plan:   Informed Consent: I have reviewed the patients History and Physical, chart, labs and discussed the procedure including the risks, benefits and alternatives for the proposed anesthesia with the patient or authorized representative who has indicated his/her understanding and acceptance.       Plan Discussed with:   Anesthesia Plan Comments:         Anesthesia Quick Evaluation

## 2024-01-01 NOTE — H&P (Signed)
 Sue Lee is a 28 y.o. female,G5P2113, [redacted]w[redacted]d, presenting for contractions and bloody show. No s/s pre-e. Hx of preterm labor at 36w. Poor fetal growth noted on 4/28.   Prenatal care: [facility] with initial visit at 11wks  Most recent u/s: [redacted]w[redacted]d, EFW 13%, AFI 15cm, post placenta, cephalic.  OB History     Gravida  5   Para  3   Term  2   Preterm  1   AB  1   Living  3      SAB  1   IAB  0   Ectopic  0   Multiple  0   Live Births  3          Past Medical History:  Diagnosis Date   Medical history non-contributory    Ovarian cyst    Rubella non-immune status, antepartum 09/13/2022   Past Surgical History:  Procedure Laterality Date   WISDOM TOOTH EXTRACTION     Family History: family history includes Diabetes in her maternal aunt, maternal uncle, and mother; Healthy in her father and mother; Hypertension in her maternal grandfather and mother. Social History:  reports that she has been smoking cigarettes. She started smoking about 3 years ago. She has a 1.7 pack-year smoking history. She has never been exposed to tobacco smoke. She has never used smokeless tobacco. She reports that she does not drink alcohol and does not use drugs.     Maternal Diabetes: No Genetic Screening: Normal Maternal Ultrasounds/Referrals: Normal Fetal Ultrasounds or other Referrals:  Referred to Materal Fetal Medicine  Maternal Substance Abuse:  No Significant Maternal Medications:  Meds include: Other:  Flexeril   Significant Maternal Lab Results:  Other:  GBS unknown Number of Prenatal Visits:greater than 3 verified prenatal visits Maternal Vaccinations:RSV: Given during pregnancy <14 days ago, TDap, and Flu Other Comments:  Borderline FGR 10%  Review of Systems Maternal Medical History:  Reason for admission: Contractions.   Contractions: Onset was 1-2 hours ago.   Prenatal Complications - Diabetes: none.   Dilation: 6.5 Effacement (%): 80 Station: -2 Exam by:: Sue Highman, RN Blood pressure 128/73, pulse 98, temperature 98.4 F (36.9 C), temperature source Oral, resp. rate 18, height 5\' 2"  (1.575 m), weight 76.1 kg, last menstrual period 04/05/2023, SpO2 99%, not currently breastfeeding.  Exam Physical Exam Constitutional:      Appearance: Normal appearance.  HENT:     Head: Normocephalic.     Mouth/Throat:     Mouth: Mucous membranes are moist.  Pulmonary:     Effort: Pulmonary effort is normal.  Musculoskeletal:        General: Normal range of motion.     Cervical back: Normal range of motion.  Skin:    General: Skin is warm and dry.  Neurological:     Mental Status: She is alert.  Psychiatric:        Mood and Affect: Mood normal.     Prenatal labs: ABO, Rh: A/Positive/-- (11/21 1012) Antibody: Negative (11/21 1012) Rubella: 2.33 (11/21 1012) RPR: Non Reactive (03/20 1228)  HBsAg: Negative (11/21 1012)  HIV: Non Reactive (03/20 1228)    Assessment/Plan: # Labor: Plan for expectant management; AROM/Pit prn # Pain: epidural placed  GBS: presumed negative  Plans for formula feeding Contraception: Sue Lee 01/01/2024, 5:15 AM

## 2024-01-01 NOTE — MAU Note (Addendum)
 Sue Lee is a 28 y.o. at [redacted]w[redacted]d here in MAU reporting: ctx since 0130 this morning. Pt states they are every 2-3 minutes apart. Pt states she thinks her water couldve broke yesterday at 0700 with clear fluid. Pt states she had more discharge than normal. Pt states the leaking did not continue and she didn't wear a pad in MAU today. Pt reports bloody show. +FM   Onset of complaint: 0130 Pain score: 9/10 Vitals:   01/01/24 0451  BP: 128/73  Pulse: 98  Resp: 18  Temp: 98.4 F (36.9 C)  SpO2: 99%     FHT:137 Lab orders placed from triage:

## 2024-01-01 NOTE — Anesthesia Postprocedure Evaluation (Signed)
 Anesthesia Post Note  Patient: Sue Lee  Procedure(s) Performed: AN AD HOC LABOR EPIDURAL     Patient location during evaluation: Mother Baby Anesthesia Type: Epidural Level of consciousness: awake and alert Pain management: pain level controlled Vital Signs Assessment: post-procedure vital signs reviewed and stable Respiratory status: spontaneous breathing, nonlabored ventilation and respiratory function stable Cardiovascular status: stable Postop Assessment: no headache, no backache and epidural receding Anesthetic complications: no   No notable events documented.  Last Vitals:  Vitals:   01/01/24 1240 01/01/24 1633  BP: 98/61 111/64  Pulse: 72 72  Resp: 16 17  Temp: 36.9 C 36.9 C  SpO2: 100%     Last Pain:  Vitals:   01/01/24 1951  TempSrc:   PainSc: 0-No pain   Pain Goal:                Epidural/Spinal Function Cutaneous sensation: Able to Wiggle Toes (01/01/24 1951), Patient able to flex knees: Yes (01/01/24 1951), Patient able to lift hips off bed: Yes (01/01/24 1951), Back pain beyond tenderness at insertion site: No (01/01/24 1951), Progressively worsening motor and/or sensory loss: No (01/01/24 1951), Bowel and/or bladder incontinence post epidural: No (01/01/24 1951)  Ritchie Cheshire

## 2024-01-01 NOTE — Lactation Note (Addendum)
 This note was copied from a baby's chart. Lactation Consultation Note  Patient Name: Sue Lee OZHYQ'M Date: 01/01/2024 Age:29 hours Reason for consult: Initial assessment;Early term 37-38.6wks, close spaced pregnancies.  MOB feeding choice is breastfeeding and formula. MOB has been exclusively breastfeeding newborn and feels infant is breastfeeding well. MOB had infant latched on her left breast when LC entering the room using the cradle hold. LC gave pillow to help support infant latch and bring infant in alignment even with MOB breast to help with deeper latch, infant breastfeed for 25 minutes. When LC was discussing infant's input and output MOB informed LC infant had one stools and 2 voids since birth. MOB is experienced with breastfeeding see maternal data below. MOB will offer formula at her discretion. MOB knows if infant is still cuing after latching on the first breast to offer the 2nd breast during the same feeding. MOB knows that latching infant to breast first for each feeding will help stimulate and establish her milk supply.  LC discussed hunger cues and signs of satiety in infant. MOB knows to call if she has any questions, concerns or needs latch assistance. LC discussed the importance of maternal rest, meals and hydration. MOB was  made aware of O/P services, breastfeeding support groups, community resources, and our phone # for post-discharge questions.     Maternal Data Has patient been taught Hand Expression?: Yes Does the patient have breastfeeding experience prior to this delivery?: Yes How long did the patient breastfeed?: Per MOB, she breastfeed her 1st child for 5 months, 2nd child for 1 months and her 51 month old for 6 months she stopped due to being pregnant.  Feeding Mother's Current Feeding Choice: Breast Milk and Formula  LATCH Score Latch: Grasps breast easily, tongue down, lips flanged, rhythmical sucking.  Audible Swallowing: A few with  stimulation  Type of Nipple: Everted at rest and after stimulation  Comfort (Breast/Nipple): Soft / non-tender  Hold (Positioning): Assistance needed to correctly position infant at breast and maintain latch.  LATCH Score: 8   Lactation Tools Discussed/Used    Interventions Interventions: Breast feeding basics reviewed;Assisted with latch;Breast compression;Adjust position;Support pillows;Position options;Expressed milk;Education;LC Services brochure  Discharge Pump: Manual (MOB would like a hand pump has one at home from her 3rd child.)  Consult Status Consult Status: Follow-up Date: 01/02/24 Follow-up type: In-patient    Pecolia Bourbon 01/01/2024, 4:27 PM

## 2024-01-01 NOTE — Anesthesia Procedure Notes (Signed)
 Epidural Patient location during procedure: OB Start time: 01/01/2024 5:44 AM End time: 01/01/2024 5:59 AM  Staffing Anesthesiologist: Earvin Goldberg, MD Performed: anesthesiologist   Preanesthetic Checklist Completed: patient identified, IV checked, site marked, risks and benefits discussed, surgical consent, monitors and equipment checked, pre-op evaluation and timeout performed  Epidural Patient position: sitting Prep: ChloraPrep Patient monitoring: heart rate, cardiac monitor, continuous pulse ox and blood pressure Approach: midline Location: L2-L3 Injection technique: LOR saline  Needle:  Needle type: Tuohy  Needle gauge: 17 G Needle length: 9 cm Needle insertion depth: 5 cm Catheter type: closed end flexible Catheter size: 20 Guage Catheter at skin depth: 9 cm Test dose: negative  Assessment Events: blood not aspirated, injection not painful, no injection resistance, no paresthesia and negative IV test  Additional Notes Reason for block:procedure for pain

## 2024-01-02 ENCOUNTER — Ambulatory Visit

## 2024-01-02 ENCOUNTER — Encounter: Admitting: Student

## 2024-01-02 DIAGNOSIS — Z30017 Encounter for initial prescription of implantable subdermal contraceptive: Secondary | ICD-10-CM | POA: Diagnosis not present

## 2024-01-02 LAB — CBC
HCT: 29.4 % — ABNORMAL LOW (ref 36.0–46.0)
Hemoglobin: 9.5 g/dL — ABNORMAL LOW (ref 12.0–15.0)
MCH: 26 pg (ref 26.0–34.0)
MCHC: 32.3 g/dL (ref 30.0–36.0)
MCV: 80.5 fL (ref 80.0–100.0)
Platelets: 253 10*3/uL (ref 150–400)
RBC: 3.65 MIL/uL — ABNORMAL LOW (ref 3.87–5.11)
RDW: 18.7 % — ABNORMAL HIGH (ref 11.5–15.5)
WBC: 10 10*3/uL (ref 4.0–10.5)
nRBC: 0 % (ref 0.0–0.2)

## 2024-01-02 MED ORDER — IBUPROFEN 600 MG PO TABS: 600.0000 mg | ORAL_TABLET | Freq: Four times a day (QID) | ORAL | 0 refills | Status: AC

## 2024-01-02 MED ORDER — LIDOCAINE HCL 1 % IJ SOLN
0.0000 mL | Freq: Once | INTRAMUSCULAR | Status: AC | PRN
  Administered 2024-01-02: 20 mL via INTRADERMAL
  Filled 2024-01-02: qty 20

## 2024-01-02 MED ORDER — ETONOGESTREL 68 MG ~~LOC~~ IMPL
68.0000 mg | DRUG_IMPLANT | Freq: Once | SUBCUTANEOUS | Status: AC
  Administered 2024-01-02: 68 mg via SUBCUTANEOUS
  Filled 2024-01-02: qty 1

## 2024-01-02 NOTE — Lactation Note (Signed)
 This note was copied from a baby's chart. Lactation Consultation Note  Patient Name: Sue Lee [id number] Date: 01/02/2024 Age:29 hours Reason for consult: Follow-up assessment;Early term 37-38.6wks;Infant weight loss (4 % weight loss,) Per mom the baby seems to really like the breast feeding feeding and latches well.  Dyad if for D/C and LC reviewed engorgement prevention and tx.  Mom aware to feed with cues and by 3 hours STS. LC provided a hand pump ( see below )   Maternal Data Has patient been taught Hand Expression?: Yes Does the patient have breastfeeding experience prior to this delivery?: Yes  Feeding Mother's Current Feeding Choice: Breast Milk and Formula  LATCH Score - 9     Lactation Tools Discussed/Used Tools: Pump;Flanges Flange Size: 18;21;24 Breast pump type: Manual;Double-Electric Breast Pump Pump Education: Milk Storage;Setup, frequency, and cleaning Reason for Pumping: PRN for now and when milk comes in the hand pump, DEBP Kit for [facility] if mom decides to continue  breast feeding  Interventions Interventions: Breast feeding basics reviewed;Hand pump;DEBP;Education;CDC milk storage guidelines;CDC Guidelines for Breast Pump Cleaning;LC Services brochure  Discharge Discharge Education: Engorgement and breast care Pump: Manual;Personal WIC Program: Yes  Consult Status Consult Status: Complete Date: 01/02/24    [name] 01/02/2024, 2:06 PM

## 2024-01-02 NOTE — Progress Notes (Addendum)
 PROCEDURE NOTE  Ms. Sue Lee is a 28 y.o. [id number] on PPD #1 requesting Nexplanon insertion.  Nexplanon Insertion Procedure Patient was given informed consent, she signed consent form. Patient does understand that irregular bleeding is a very common side effect of this medication. Appropriate time out taken. Patient's left arm was prepped and draped in the usual sterile fashion. The  insertion area was measured. Patient was prepped with alcohol swab and then injected with 3 ml of 1% lidocaine . She was prepped with betadine, Nexplanon removed from packaging, Device confirmed in needle, then inserted full length of needle and withdrawn per handbook instructions. Nexplanon was able to palpated in the patient's arm; patient palpated the insert herself. There was minimal blood loss.  Patient insertion site covered with steri stirps and a pressure bandage to reduce any bruising. The patient tolerated the procedure well and was given post procedure instructions.   Wilford Hanks, Student-MidWife 01/02/2024 2:39 PM

## 2024-01-08 ENCOUNTER — Encounter: Payer: Self-pay | Admitting: Obstetrics and Gynecology

## 2024-01-13 DIAGNOSIS — Z0289 Encounter for other administrative examinations: Secondary | ICD-10-CM

## 2024-01-14 ENCOUNTER — Telehealth (HOSPITAL_COMMUNITY): Payer: Self-pay | Admitting: *Deleted

## 2024-01-14 NOTE — Telephone Encounter (Signed)
 01/14/2024  Name: Sue Lee MRN: [id number] DOB: [birth year]  Reason for Call:  Transition of Care Hospital Discharge Call  Contact Status: Patient Contact Status: Complete  Language assistant needed:          Follow-Up Questions: Do You Have Any Concerns About Your Health As You Heal From Delivery?: Yes What Concerns Do You Have About Your Health?: Patient asked, Is there anything I'm supposed to be doing for the stitches? RN reviewed peri-care with patient and warning signs to report to MD. Patient verbalized understanding. Voiced no other questions or concerns at this time. Do You Have Any Concerns About Your Infants Health?: No  Edinburgh Postnatal Depression Scale:  In the Past 7 Days: I have been able to laugh and see the funny side of things.: As much as I always could I have looked forward with enjoyment to things.: As much as I ever did I have blamed myself unnecessarily when things went wrong.: No, never I have been anxious or worried for no good reason.: No, not at all I have felt scared or panicky for no good reason.: No, not at all Things have been getting on top of me.: No, I have been coping as well as ever I have been so unhappy that I have had difficulty sleeping.: Not at all I have felt sad or miserable.: No, not at all I have been so unhappy that I have been crying.: No, never The thought of harming myself has occurred to me.: Never Dimple Francis Postnatal Depression Scale Total: 0  PHQ2-9 Depression Scale:     Discharge Follow-up: Edinburgh score requires follow up?: No Patient was advised of the following resources:: Breastfeeding Support Group, Support Group  Post-discharge interventions: Reviewed Newborn Safe Sleep Practices  Signature Julien Odor, RN, 01/14/24, [phone]

## 2024-02-04 ENCOUNTER — Encounter: Payer: Self-pay | Admitting: Obstetrics and Gynecology

## 2024-02-04 ENCOUNTER — Telehealth: Admitting: Obstetrics and Gynecology

## 2024-02-04 DIAGNOSIS — O09893 Supervision of other high risk pregnancies, third trimester: Secondary | ICD-10-CM

## 2024-02-04 DIAGNOSIS — Z3480 Encounter for supervision of other normal pregnancy, unspecified trimester: Secondary | ICD-10-CM

## 2024-02-04 NOTE — Progress Notes (Addendum)
 Provider location: [facility] [employer] [facility]   Patient location: Home  I connected withNAME@ on 02/04/24 at  1:55 PM EDT by Mychart Video Encounter and verified that I am speaking with the correct person using two identifiers.       I discussed the limitations, risks, security and privacy concerns of performing an evaluation and management service virtually and the availability of in person appointments. I also discussed with the patient that there may be a patient responsible charge related to this service. The patient expressed understanding and agreed to proceed.  Post Partum Visit Note Subjective:   Sue Lee is a 28 y.o. [id number] female who presents for a postpartum visit. She is 4.6 weeks postpartum following a normal spontaneous vaginal delivery.  I have fully reviewed the prenatal and intrapartum course. The delivery was at 38.5 gestational weeks.  Anesthesia: epidural. Postpartum course has been uncomplicated. Baby is doing well. Baby is feeding by breast. Bleeding staining only. Bowel function is normal. Bladder function is normal. Patient is not sexually active. Contraception method is Nexplanon . Postpartum depression screening: negative.   Upstream - 02/04/24 1346       Pregnancy Intention Screening   Does the patient want to become pregnant in the next year? No    Does the patient's partner want to become pregnant in the next year? No    Would the patient like to discuss contraceptive options today? N/A   nexplanon  in place     Contraception Wrap Up   Current Method Hormonal Implant         The pregnancy intention screening data noted above was reviewed. Potential methods of contraception were discussed. The patient elected to proceed with No data recorded.   Edinburgh Postnatal Depression Scale - 02/04/24 1345       Edinburgh Postnatal Depression Scale:  In the Past 7 Days   I have been able to laugh and see the funny side of things. 0     I have looked forward with enjoyment to things. 0    I have blamed myself unnecessarily when things went wrong. 0    I have been anxious or worried for no good reason. 0    I have felt scared or panicky for no good reason. 0    Things have been getting on top of me. 0    I have been so unhappy that I have had difficulty sleeping. 0    I have felt sad or miserable. 0    I have been so unhappy that I have been crying. 0    The thought of harming myself has occurred to me. 0    Edinburgh Postnatal Depression Scale Total 0          The following portions of the patient's history were reviewed and updated as appropriate: allergies, current medications, past family history, past medical history, past social history, past surgical history, and problem list.  Review of Systems Pertinent items are noted in HPI.  Objective:  LMP 04/05/2023 (Exact Date)   Breastfeeding Yes     General:  Alert, oriented and cooperative. Patient is in no acute distress.  Respiratory: Normal respiratory effort, no problems with respiration noted  Mental Status: Normal mood and affect. Normal behavior. Normal judgment and thought content.  Rest of physical exam deferred due to type of encounter   Assessment:    normal postpartum exam.  Plan:  Essential components of care per ACOG recommendations:  1.  Mood and well being: Patient with negative depression screening today. Reviewed local resources for support.  - Patient does not use tobacco.  - hx of drug use? No    2. Infant care and feeding:  -Patient currently breastmilk feeding? Yes  -Social determinants of health (SDOH) reviewed in EPIC. No concerns  3. Sexuality, contraception and birth spacing - Patient does not want a pregnancy in the next year.  Desired family size is unknown children.  - Reviewed forms of contraception in tiered fashion. Patient desired nexplanon  today.   - Discussed birth spacing of 18 months  4. Sleep and  fatigue -Encouraged family/partner/community support of 4 hrs of uninterrupted sleep to help with mood and fatigue  5. Physical Recovery  - Discussed patients delivery and complications - Patient had a 1st degree laceration, perineal healing reviewed. Patient expressed understanding - Patient has urinary incontinence? No Patient was referred to pelvic floor PT for pelvic floor tightness  - Patient is safe to resume physical and sexual activity  6.  Health Maintenance - Last pap smear done 2024 and was normal with negative HPV.    I provided 12 minutes of face-to-face time during this encounter.    Return in about 1 year (around 02/03/2025) for annual.  No future appointments.   Burnard CHRISTELLA Moats, MD Center for [facility], [facility]
